# Patient Record
Sex: Female | Born: 1960 | Race: White | Hispanic: No | Marital: Married | Smoking: Never smoker
Health system: Southern US, Community
[De-identification: ages and names within clinical notes are randomized; demographics above are authoritative.]

## PROBLEM LIST (undated history)

## (undated) DIAGNOSIS — C801 Malignant (primary) neoplasm, unspecified: Secondary | ICD-10-CM

## (undated) DIAGNOSIS — Z923 Personal history of irradiation: Secondary | ICD-10-CM

## (undated) DIAGNOSIS — C50919 Malignant neoplasm of unspecified site of unspecified female breast: Secondary | ICD-10-CM

## (undated) DIAGNOSIS — Z8041 Family history of malignant neoplasm of ovary: Secondary | ICD-10-CM

## (undated) DIAGNOSIS — I10 Essential (primary) hypertension: Secondary | ICD-10-CM

## (undated) DIAGNOSIS — T4145XA Adverse effect of unspecified anesthetic, initial encounter: Secondary | ICD-10-CM

## (undated) DIAGNOSIS — T8859XA Other complications of anesthesia, initial encounter: Secondary | ICD-10-CM

## (undated) DIAGNOSIS — Z803 Family history of malignant neoplasm of breast: Secondary | ICD-10-CM

## (undated) HISTORY — PX: COLONOSCOPY: SHX174

## (undated) HISTORY — PX: REDUCTION MAMMAPLASTY: SUR839

## (undated) HISTORY — DX: Family history of malignant neoplasm of breast: Z80.3

## (undated) HISTORY — PX: COSMETIC SURGERY: SHX468

## (undated) HISTORY — DX: Family history of malignant neoplasm of ovary: Z80.41

---
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
## 1998-01-04 ENCOUNTER — Inpatient Hospital Stay (HOSPITAL_COMMUNITY): Admission: AD | Admit: 1998-01-04 | Discharge: 1998-01-04 | Payer: Self-pay | Admitting: Obstetrics and Gynecology

## 1998-03-08 ENCOUNTER — Observation Stay (HOSPITAL_COMMUNITY): Admission: AD | Admit: 1998-03-08 | Discharge: 1998-03-09 | Payer: Self-pay | Admitting: *Deleted

## 1998-04-14 ENCOUNTER — Ambulatory Visit (HOSPITAL_COMMUNITY): Admission: RE | Admit: 1998-04-14 | Discharge: 1998-04-14 | Payer: Self-pay | Admitting: Obstetrics and Gynecology

## 1998-05-12 ENCOUNTER — Inpatient Hospital Stay (HOSPITAL_COMMUNITY): Admission: AD | Admit: 1998-05-12 | Discharge: 1998-05-12 | Payer: Self-pay | Admitting: Obstetrics and Gynecology

## 1998-05-15 ENCOUNTER — Inpatient Hospital Stay: Admission: EM | Admit: 1998-05-15 | Discharge: 1998-05-15 | Payer: Self-pay | Admitting: *Deleted

## 1998-05-18 ENCOUNTER — Inpatient Hospital Stay (HOSPITAL_COMMUNITY): Admission: AD | Admit: 1998-05-18 | Discharge: 1998-05-20 | Payer: Self-pay | Admitting: Obstetrics and Gynecology

## 1998-06-17 ENCOUNTER — Inpatient Hospital Stay (HOSPITAL_COMMUNITY): Admission: AD | Admit: 1998-06-17 | Discharge: 1998-06-17 | Payer: Self-pay | Admitting: Obstetrics and Gynecology

## 1998-06-21 ENCOUNTER — Inpatient Hospital Stay (HOSPITAL_COMMUNITY): Admission: AD | Admit: 1998-06-21 | Discharge: 1998-06-24 | Payer: Self-pay | Admitting: Obstetrics and Gynecology

## 1999-04-06 ENCOUNTER — Encounter: Payer: Self-pay | Admitting: General Surgery

## 1999-04-06 ENCOUNTER — Emergency Department (HOSPITAL_COMMUNITY): Admission: EM | Admit: 1999-04-06 | Discharge: 1999-04-06 | Payer: Self-pay | Admitting: Emergency Medicine

## 1999-07-21 ENCOUNTER — Emergency Department (HOSPITAL_COMMUNITY): Admission: EM | Admit: 1999-07-21 | Discharge: 1999-07-21 | Payer: Self-pay | Admitting: Emergency Medicine

## 1999-10-16 ENCOUNTER — Ambulatory Visit (HOSPITAL_COMMUNITY): Admission: RE | Admit: 1999-10-16 | Discharge: 1999-10-16 | Payer: Self-pay | Admitting: Family Medicine

## 1999-10-16 ENCOUNTER — Encounter: Payer: Self-pay | Admitting: Family Medicine

## 2001-12-10 ENCOUNTER — Encounter: Admission: RE | Admit: 2001-12-10 | Discharge: 2002-03-10 | Payer: Self-pay | Admitting: Family Medicine

## 2003-01-27 ENCOUNTER — Other Ambulatory Visit: Admission: RE | Admit: 2003-01-27 | Discharge: 2003-01-27 | Payer: Self-pay | Admitting: Obstetrics and Gynecology

## 2004-09-19 ENCOUNTER — Other Ambulatory Visit: Admission: RE | Admit: 2004-09-19 | Discharge: 2004-09-19 | Payer: Self-pay | Admitting: Obstetrics & Gynecology

## 2005-11-23 ENCOUNTER — Other Ambulatory Visit: Admission: RE | Admit: 2005-11-23 | Discharge: 2005-11-23 | Payer: Self-pay | Admitting: Obstetrics & Gynecology

## 2007-01-28 ENCOUNTER — Encounter: Admission: RE | Admit: 2007-01-28 | Discharge: 2007-01-28 | Payer: Self-pay | Admitting: Obstetrics & Gynecology

## 2012-07-07 ENCOUNTER — Encounter: Payer: Self-pay | Admitting: Gastroenterology

## 2012-08-18 ENCOUNTER — Encounter: Payer: Self-pay | Admitting: Gastroenterology

## 2012-08-18 ENCOUNTER — Ambulatory Visit (AMBULATORY_SURGERY_CENTER): Payer: 59 | Admitting: *Deleted

## 2012-08-18 VITALS — Ht 64.0 in | Wt 171.6 lb

## 2012-08-18 DIAGNOSIS — Z1211 Encounter for screening for malignant neoplasm of colon: Secondary | ICD-10-CM

## 2012-08-18 MED ORDER — MOVIPREP 100 G PO SOLR: ORAL | Status: DC

## 2012-08-22 ENCOUNTER — Ambulatory Visit (AMBULATORY_SURGERY_CENTER): Payer: 59 | Admitting: Gastroenterology

## 2012-08-22 ENCOUNTER — Encounter: Payer: Self-pay | Admitting: Gastroenterology

## 2012-08-22 VITALS — BP 130/88 | HR 50 | Temp 98.2°F | Resp 14 | Ht 64.0 in | Wt 164.0 lb

## 2012-08-22 DIAGNOSIS — D126 Benign neoplasm of colon, unspecified: Secondary | ICD-10-CM

## 2012-08-22 DIAGNOSIS — Z1211 Encounter for screening for malignant neoplasm of colon: Secondary | ICD-10-CM

## 2012-08-22 DIAGNOSIS — D129 Benign neoplasm of anus and anal canal: Secondary | ICD-10-CM

## 2012-08-22 DIAGNOSIS — D128 Benign neoplasm of rectum: Secondary | ICD-10-CM

## 2012-08-22 MED ORDER — SODIUM CHLORIDE 0.9 % IV SOLN: 500.0000 mL | INTRAVENOUS | Status: DC

## 2012-08-22 NOTE — Progress Notes (Signed)
Patient did not experience any of the following events: a burn prior to discharge; a fall within the facility; wrong site/side/patient/procedure/implant event; or a hospital transfer or hospital admission upon discharge from the facility. (G8907) Patient did not have preoperative order for IV antibiotic SSI prophylaxis. (G8918)  

## 2012-08-22 NOTE — Op Note (Addendum)
[facility] [address] [place], [address]   COLONOSCOPY PROCEDURE REPORT  PATIENT: Bonnie Marquez, Bonnie Marquez  MR#: [id number] BIRTHDATE: 1961/01/09 , 51  yrs. old GENDER: Female ENDOSCOPIST: Meryl Dare, MD, [facility] REFERRED XB:JYNWGN Foy [place], M.D.  Annamaria Helling, M.D. PROCEDURE DATE:  08/22/2012 PROCEDURE:   Colonoscopy with snare polypectomy and Colonoscopy with biopsy ASA CLASS:   Class I INDICATIONS: average risk screening,  heme + stool MEDICATIONS: MAC sedation, administered by CRNA and propofol (Diprivan) 230mg  IV DESCRIPTION OF PROCEDURE:   After the risks benefits and alternatives of the procedure were thoroughly explained, informed consent was obtained.  A digital rectal exam revealed no abnormalities of the rectum.   The LB CF-[id number]  endoscope was introduced through the anus and advanced to the cecum, which was identified by both the appendix and ileocecal valve. No adverse events experienced.   The quality of the prep was good, using MoviPrep  The instrument was then slowly withdrawn as the colon was fully examined.  COLON FINDINGS: A semi-pedunculated polyp measuring 8 mm in size was found in the transverse colon.  A polypectomy was performed using snare cautery.  The resection was complete and the polyp tissue was completely retrieved.   A sessile polyp measuring 4 mm in size was found in the rectum.  A polypectomy was performed with cold forceps.  The resection was complete and the polyp tissue was completely retrieved.   The colon was otherwise normal.  There was no diverticulosis, inflammation, polyps or cancers unless previously stated.  Retroflexed views revealed small internal hemorrhoids. The time to cecum=2 minutes 06 seconds.  Withdrawal time=9 minutes 33 seconds.  The scope was withdrawn and the procedure completed.  COMPLICATIONS: There were no complications.  ENDOSCOPIC IMPRESSION: 1.   Semi-pedunculated polyp measuring 8 mm in  the transverse colon; polypectomy was performed using snare cautery 2.   Sessile polyp measuring 4 mm in size was found in the rectum; polypectomy was performed with cold forceps 3.   Small internal hemorrhoids  RECOMMENDATIONS: 1.  Hold aspirin, aspirin products, and anti-inflammatory medication for 2 weeks. 2.  Await pathology results 3.  Repeat colonoscopy in 5 years if polyp adenomatous; otherwise 10 years  eSigned:  Meryl Dare, MD, [facility] 08/22/2012 10:39 AM Revised: 08/22/2012 10:39 AM

## 2012-08-22 NOTE — Patient Instructions (Addendum)
YOU HAD AN ENDOSCOPIC PROCEDURE TODAY AT THE [facility]: Refer to the procedure report that was given to you for any specific questions about what was found during the examination.  If the procedure report does not answer your questions, please call your gastroenterologist to clarify.  If you requested that your care partner not be given the details of your procedure findings, then the procedure report has been included in a sealed envelope for you to review at your convenience later.  YOU SHOULD EXPECT: Some feelings of bloating in the abdomen. Passage of more gas than usual.  Walking can help get rid of the air that was put into your GI tract during the procedure and reduce the bloating. If you had a lower endoscopy (such as a colonoscopy or flexible sigmoidoscopy) you may notice spotting of blood in your stool or on the toilet paper. If you underwent a bowel prep for your procedure, then you may not have a normal bowel movement for a few days.  DIET: Your first meal following the procedure should be a light meal and then it is ok to progress to your normal diet.  A half-sandwich or bowl of soup is an example of a good first meal.  Heavy or fried foods are harder to digest and may make you feel nauseous or bloated.  Likewise meals heavy in dairy and vegetables can cause extra gas to form and this can also increase the bloating.  Drink plenty of fluids but you should avoid alcoholic beverages for 24 hours.  ACTIVITY: Your care partner should take you home directly after the procedure.  You should plan to take it easy, moving slowly for the rest of the day.  You can resume normal activity the day after the procedure however you should NOT DRIVE or use heavy machinery for 24 hours (because of the sedation medicines used during the test).    SYMPTOMS TO REPORT IMMEDIATELY: A gastroenterologist can be reached at any hour.  During normal business hours, 8:30 AM to 5:00 PM Monday through Friday,  call [phone].  After hours and on weekends, please call the GI answering service at [phone] who will take a message and have the physician on call contact you.   Following lower endoscopy (colonoscopy or flexible sigmoidoscopy):  Excessive amounts of blood in the stool  Significant tenderness or worsening of abdominal pains  Swelling of the abdomen that is new, acute  Fever of 100F or higher   If any biopsies were taken you will be contacted by phone or by letter within the next 1-3 weeks.  Call your gastroenterologist if you have not heard about the biopsies in 3 weeks.  Our staff will call the home number listed on your records the next business day following your procedure to check on you and address any questions or concerns that you may have at that time regarding the information given to you following your procedure. This is a courtesy call and so if there is no answer at the home number and we have not heard from you through the emergency physician on call, we will assume that you have returned to your regular daily activities without incident.  SIGNATURES/CONFIDENTIALITY: You and/or your care partner have signed paperwork which will be entered into your electronic medical record.  These signatures attest to the fact that that the information above on your After Visit Summary has been reviewed and is understood.  Full responsibility of the confidentiality of this discharge  information lies with you and/or your care-partner.   Polyp information given.    Hold aspirin, aspirin products and anti-inflammatory medications for 2 weeks.

## 2012-08-25 ENCOUNTER — Telehealth: Payer: Self-pay | Admitting: *Deleted

## 2012-08-25 NOTE — Telephone Encounter (Signed)
  Follow up Call-  Call back number 08/22/2012  Post procedure Call Back phone  # [phone]  Permission to leave phone message Yes     Patient questions:  Do you have a fever, pain , or abdominal swelling? no Pain Score  0 *  Have you tolerated food without any problems? yes  Have you been able to return to your normal activities? yes  Do you have any questions about your discharge instructions: Diet   no Medications  no Follow up visit  no  Do you have questions or concerns about your Care? no  Actions: * If pain score is 4 or above: No action needed, pain <4.

## 2012-08-26 ENCOUNTER — Encounter: Payer: Self-pay | Admitting: Gastroenterology

## 2013-03-31 ENCOUNTER — Other Ambulatory Visit: Payer: Self-pay | Admitting: Obstetrics & Gynecology

## 2014-07-26 ENCOUNTER — Other Ambulatory Visit: Payer: Self-pay | Admitting: Obstetrics & Gynecology

## 2014-07-27 LAB — CYTOLOGY - PAP

## 2015-10-30 DIAGNOSIS — C50919 Malignant neoplasm of unspecified site of unspecified female breast: Secondary | ICD-10-CM

## 2015-10-30 DIAGNOSIS — Z923 Personal history of irradiation: Secondary | ICD-10-CM

## 2015-10-30 HISTORY — PX: BREAST LUMPECTOMY: SHX2

## 2015-10-30 HISTORY — DX: Malignant neoplasm of unspecified site of unspecified female breast: C50.919

## 2015-10-30 HISTORY — DX: Personal history of irradiation: Z92.3

## 2016-08-23 ENCOUNTER — Other Ambulatory Visit: Payer: Self-pay | Admitting: Obstetrics & Gynecology

## 2016-08-23 DIAGNOSIS — Z1231 Encounter for screening mammogram for malignant neoplasm of breast: Secondary | ICD-10-CM

## 2016-08-24 ENCOUNTER — Ambulatory Visit
Admission: RE | Admit: 2016-08-24 | Discharge: 2016-08-24 | Disposition: A | Discharge status: Home / Self Care | Payer: PRIVATE HEALTH INSURANCE | Source: Ambulatory Visit | Attending: Obstetrics & Gynecology | Admitting: Obstetrics & Gynecology

## 2016-08-24 ENCOUNTER — Other Ambulatory Visit: Payer: Self-pay | Admitting: Obstetrics & Gynecology

## 2016-08-24 DIAGNOSIS — Z1231 Encounter for screening mammogram for malignant neoplasm of breast: Secondary | ICD-10-CM

## 2016-08-27 ENCOUNTER — Other Ambulatory Visit: Payer: Self-pay | Admitting: Obstetrics & Gynecology

## 2016-08-28 LAB — CYTOLOGY - PAP

## 2016-08-29 ENCOUNTER — Other Ambulatory Visit: Payer: Self-pay | Admitting: Obstetrics & Gynecology

## 2016-08-29 DIAGNOSIS — R928 Other abnormal and inconclusive findings on diagnostic imaging of breast: Secondary | ICD-10-CM

## 2016-08-31 ENCOUNTER — Ambulatory Visit
Admission: RE | Admit: 2016-08-31 | Discharge: 2016-08-31 | Disposition: A | Discharge status: Home / Self Care | Payer: No Typology Code available for payment source | Source: Ambulatory Visit | Attending: Obstetrics & Gynecology | Admitting: Obstetrics & Gynecology

## 2016-08-31 ENCOUNTER — Other Ambulatory Visit: Payer: Self-pay | Admitting: Obstetrics & Gynecology

## 2016-08-31 DIAGNOSIS — R928 Other abnormal and inconclusive findings on diagnostic imaging of breast: Secondary | ICD-10-CM

## 2016-08-31 DIAGNOSIS — N6320 Unspecified lump in the left breast, unspecified quadrant: Secondary | ICD-10-CM

## 2016-09-03 ENCOUNTER — Other Ambulatory Visit: Payer: No Typology Code available for payment source

## 2016-09-03 ENCOUNTER — Telehealth (HOSPITAL_COMMUNITY): Payer: Self-pay | Admitting: *Deleted

## 2016-09-03 NOTE — Telephone Encounter (Signed)
Telephoned patient at home number and left message to return call to [facility] 

## 2016-09-06 ENCOUNTER — Other Ambulatory Visit: Payer: Self-pay | Admitting: Obstetrics & Gynecology

## 2016-09-06 DIAGNOSIS — N6320 Unspecified lump in the left breast, unspecified quadrant: Secondary | ICD-10-CM

## 2016-09-07 ENCOUNTER — Ambulatory Visit (HOSPITAL_COMMUNITY)
Admission: RE | Admit: 2016-09-07 | Discharge: 2016-09-07 | Disposition: A | Discharge status: Home / Self Care | Payer: No Typology Code available for payment source | Source: Ambulatory Visit | Attending: Obstetrics and Gynecology | Admitting: Obstetrics and Gynecology

## 2016-09-07 ENCOUNTER — Other Ambulatory Visit: Payer: Self-pay | Admitting: Obstetrics & Gynecology

## 2016-09-07 ENCOUNTER — Encounter (HOSPITAL_COMMUNITY): Payer: Self-pay

## 2016-09-07 ENCOUNTER — Ambulatory Visit
Admission: RE | Admit: 2016-09-07 | Discharge: 2016-09-07 | Disposition: A | Discharge status: Home / Self Care | Payer: No Typology Code available for payment source | Source: Ambulatory Visit | Attending: Obstetrics & Gynecology | Admitting: Obstetrics & Gynecology

## 2016-09-07 VITALS — BP 162/110 | Temp 97.9°F | Ht 64.0 in | Wt 171.6 lb

## 2016-09-07 DIAGNOSIS — Z1239 Encounter for other screening for malignant neoplasm of breast: Secondary | ICD-10-CM

## 2016-09-07 DIAGNOSIS — N6320 Unspecified lump in the left breast, unspecified quadrant: Secondary | ICD-10-CM

## 2016-09-07 HISTORY — DX: Essential (primary) hypertension: I10

## 2016-09-07 HISTORY — PX: BREAST BIOPSY: SHX20

## 2016-09-07 NOTE — Patient Instructions (Signed)
Explained breast self awareness with Scherry Ran. Patient did not need a Pap smear today due to last Pap smear was 08/27/2016. Let her know [facility]CP will cover Pap smears every 3 years unless has a history of abnormal Pap smears. Referred patient to the [facility] for a left breast biopsy per recommendation. Appointment scheduled for Friday, September 07, 2016 at 1545. Let patient know the [facility] will follow up with her within the next week with results of biopsy. Gerrit Heck Gunnerson verbalized understanding.  Danijela Vessey, Arvil Chaco, RN 3:37 PM

## 2016-09-07 NOTE — Progress Notes (Signed)
Patient referred to [facility] by the [facility] due to recommending a left breast biopsy. Screening mammogram completed 08/24/2016 and left breast diagnostic mammogram completed 08/31/2016.  Pap Smear:  Pap smear not completed today. Last Pap smear was 08/27/2016 at [facility] and normal. Per patient has no history of an abnormal Pap smear. Last three Pap smear results are in EPIC.  Physical exam: Breasts Breasts symmetrical. No skin abnormalities bilateral breasts. No nipple retraction bilateral breasts. No nipple discharge bilateral breasts. No lymphadenopathy. No lumps palpated bilateral breasts. No complaints of pain or tenderness on exam. Referred patient to the [facility] for a left breast biopsy per recommendation. Appointment scheduled for Friday, September 07, 2016 at 1545.        Pelvic/Bimanual No Pap smear completed today since last Pap smear was 08/27/2016. Pap smear not indicated per B[facility]CP guidelines.   Smoking History: Patient has never smoked.  Patient Navigation: Patient education provided. Access to services provided for patient through [facility] program.   Colorectal Cancer Screening: Per patient had a colonoscopy completed in 2016. No complaints today.

## 2016-09-10 ENCOUNTER — Encounter (HOSPITAL_COMMUNITY): Payer: Self-pay | Admitting: *Deleted

## 2016-09-11 ENCOUNTER — Telehealth: Payer: Self-pay | Admitting: *Deleted

## 2016-09-11 NOTE — Telephone Encounter (Signed)
Left vm for return call regarding [facility] for 09/19/16. Contact information provided.

## 2016-09-12 ENCOUNTER — Other Ambulatory Visit: Payer: Self-pay | Admitting: Obstetrics & Gynecology

## 2016-09-12 ENCOUNTER — Telehealth: Payer: Self-pay | Admitting: *Deleted

## 2016-09-12 DIAGNOSIS — N6320 Unspecified lump in the left breast, unspecified quadrant: Secondary | ICD-10-CM

## 2016-09-12 DIAGNOSIS — Z170 Estrogen receptor positive status [ER+]: Principal | ICD-10-CM

## 2016-09-12 DIAGNOSIS — C50212 Malignant neoplasm of upper-inner quadrant of left female breast: Secondary | ICD-10-CM | POA: Insufficient documentation

## 2016-09-12 NOTE — Telephone Encounter (Signed)
Left message for a return phone call to schedule for [facility] 11/22.

## 2016-09-12 NOTE — Telephone Encounter (Signed)
Received call back from patient.  Confirmed [facility] for 09/19/16 at 815am .  Instructions and contact information given.

## 2016-09-13 ENCOUNTER — Ambulatory Visit
Admission: RE | Admit: 2016-09-13 | Discharge: 2016-09-13 | Disposition: A | Discharge status: Home / Self Care | Payer: No Typology Code available for payment source | Source: Ambulatory Visit | Attending: Obstetrics & Gynecology | Admitting: Obstetrics & Gynecology

## 2016-09-13 DIAGNOSIS — N6320 Unspecified lump in the left breast, unspecified quadrant: Secondary | ICD-10-CM

## 2016-09-13 HISTORY — PX: BREAST BIOPSY: SHX20

## 2016-09-19 ENCOUNTER — Encounter: Payer: Self-pay | Admitting: Hematology and Oncology

## 2016-09-19 ENCOUNTER — Ambulatory Visit: Payer: Self-pay | Admitting: General Surgery

## 2016-09-19 ENCOUNTER — Ambulatory Visit (HOSPITAL_BASED_OUTPATIENT_CLINIC_OR_DEPARTMENT_OTHER): Payer: No Typology Code available for payment source | Admitting: Hematology and Oncology

## 2016-09-19 ENCOUNTER — Encounter: Payer: Self-pay | Admitting: Physical Therapy

## 2016-09-19 ENCOUNTER — Ambulatory Visit: Payer: No Typology Code available for payment source | Attending: General Surgery | Admitting: Physical Therapy

## 2016-09-19 ENCOUNTER — Other Ambulatory Visit (HOSPITAL_BASED_OUTPATIENT_CLINIC_OR_DEPARTMENT_OTHER): Payer: No Typology Code available for payment source

## 2016-09-19 ENCOUNTER — Ambulatory Visit
Admission: RE | Admit: 2016-09-19 | Discharge: 2016-09-19 | Disposition: A | Discharge status: Home / Self Care | Payer: No Typology Code available for payment source | Source: Ambulatory Visit | Attending: Radiation Oncology | Admitting: Radiation Oncology

## 2016-09-19 ENCOUNTER — Other Ambulatory Visit: Payer: Self-pay | Admitting: General Surgery

## 2016-09-19 DIAGNOSIS — Z170 Estrogen receptor positive status [ER+]: Secondary | ICD-10-CM

## 2016-09-19 DIAGNOSIS — C50212 Malignant neoplasm of upper-inner quadrant of left female breast: Secondary | ICD-10-CM

## 2016-09-19 DIAGNOSIS — R293 Abnormal posture: Secondary | ICD-10-CM | POA: Insufficient documentation

## 2016-09-19 LAB — COMPREHENSIVE METABOLIC PANEL
ALBUMIN: 4.1 g/dL (ref 3.5–5.0)
ALK PHOS: 78 U/L (ref 40–150)
ALT: 57 U/L — AB (ref 0–55)
ANION GAP: 12 meq/L — AB (ref 3–11)
AST: 50 U/L — ABNORMAL HIGH (ref 5–34)
BILIRUBIN TOTAL: 1.28 mg/dL — AB (ref 0.20–1.20)
BUN: 15.2 mg/dL (ref 7.0–26.0)
CO2: 27 mEq/L (ref 22–29)
CREATININE: 0.8 mg/dL (ref 0.6–1.1)
Calcium: 10.6 mg/dL — ABNORMAL HIGH (ref 8.4–10.4)
Chloride: 99 mEq/L (ref 98–109)
EGFR: 88 mL/min/{1.73_m2} — AB (ref >90)
Glucose: 92 mg/dl (ref 70–140)
Potassium: 3.9 mEq/L (ref 3.5–5.1)
Sodium: 138 mEq/L (ref 136–145)
TOTAL PROTEIN: 7.7 g/dL (ref 6.4–8.3)

## 2016-09-19 LAB — CBC WITH DIFFERENTIAL/PLATELET
BASO%: 0.5 % (ref 0.0–2.0)
Basophils Absolute: 0 10*3/uL (ref 0.0–0.1)
EOS ABS: 0.2 10*3/uL (ref 0.0–0.5)
EOS%: 2.9 % (ref 0.0–7.0)
HEMATOCRIT: 42.7 % (ref 34.8–46.6)
HEMOGLOBIN: 14.4 g/dL (ref 11.6–15.9)
LYMPH#: 1.5 10*3/uL (ref 0.9–3.3)
LYMPH%: 27.1 % (ref 14.0–49.7)
MCH: 31.9 pg (ref 25.1–34.0)
MCHC: 33.7 g/dL (ref 31.5–36.0)
MCV: 94.5 fL (ref 79.5–101.0)
MONO#: 0.6 10*3/uL (ref 0.1–0.9)
MONO%: 11.5 % (ref 0.0–14.0)
NEUT%: 58 % (ref 38.4–76.8)
NEUTROS ABS: 3.2 10*3/uL (ref 1.5–6.5)
PLATELETS: 254 10*3/uL (ref 145–400)
RBC: 4.52 10*6/uL (ref 3.70–5.45)
RDW: 13.2 % (ref 11.2–14.5)
WBC: 5.5 10*3/uL (ref 3.9–10.3)

## 2016-09-19 NOTE — Therapy (Signed)
[facility] [place], [place], [address] Phone: [phone]   Fax:  [phone]  Physical Therapy Evaluation  Patient Details  Name: Bonnie Marquez MRN: [id number] Date of Birth: 1960/11/03 Referring Provider: Dr. Autumn Messing  Encounter Date: 09/19/2016      PT End of Session - 09/19/16 1119    Visit Number 1   Number of Visits 1   PT Start Time 0950   PT Stop Time 1013   PT Time Calculation (min) 23 min   Activity Tolerance Patient tolerated treatment well   Behavior During Therapy [facility] for tasks assessed/performed      Past Medical History:  Diagnosis Date  . Hypertension     Past Surgical History:  Procedure Laterality Date  . APPENDECTOMY  1975  . [place]   x 2  . EYE MUSCLE SURGERY  1976   bilateral    There were no vitals filed for this visit.       Subjective Assessment - 09/19/16 1113    Subjective Patient reports she is here today to be seen for her newly diagnosed left breast cancer.   Patient is accompained by: Family member   Pertinent History Patient was diagnosed on 08/24/16 with left grade 1 invasive ductal carcinoma breast cancer.  It is ER/PR positive and HER2 negative with a Ki67 of 15%. It measures 6 mm and is located in the upper inner quadrant.   Patient Stated Goals Reduce lymphedema risk and learn post op shoulder ROM HEP   Currently in Pain? No/denies            [facility] PT Assessment - 09/19/16 0001      Assessment   Medical Diagnosis Left breast cancer   Referring Provider Dr. Autumn Messing   Onset Date/Surgical Date 08/24/16   Hand Dominance Right   Prior Therapy none     Precautions   Precautions Other (comment)   Precaution Comments active cancer     Restrictions   Weight Bearing Restrictions No     Balance Screen   Has the patient fallen in the past 6 months No   Has the patient had a decrease in activity level because of a fear of  falling?  No   Is the patient reluctant to leave their home because of a fear of falling?  No     Home Social worker Private residence   Living Arrangements Spouse/significant other;Children  Husband and 1 y.o. 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, hospital next week   Leisure [employer] daily 30-60 minutes     Cognition   Overall Cognitive Status Within Functional Limits for tasks assessed     Posture/Postural Control   Posture/Postural Control Postural limitations   Postural Limitations Forward head;Rounded Shoulders     ROM / Strength   AROM / PROM / Strength AROM;Strength     AROM   AROM Assessment Site Shoulder;Cervical   Right/Left Shoulder Right;Left   Right Shoulder Extension 46 Degrees   Right Shoulder Flexion 130 Degrees   Right Shoulder ABduction 148 Degrees   Right Shoulder Internal Rotation 72 Degrees   Right Shoulder External Rotation 76 Degrees   Left Shoulder Extension 52 Degrees   Left Shoulder  Flexion 144 Degrees   Left Shoulder ABduction 145 Degrees   Left Shoulder Internal Rotation 74 Degrees   Left Shoulder External Rotation 66 Degrees   Cervical Flexion WNL   Cervical Extension WNL   Cervical - Right Side Bend WNL   Cervical - Left Side Bend WNL   Cervical - Right Rotation WNL   Cervical - Left Rotation WNL     Strength   Overall Strength Within functional limits for tasks performed           LYMPHEDEMA/ONCOLOGY QUESTIONNAIRE - 09/19/16 1117      Type   Cancer Type Left breast cancer     Lymphedema Assessments   Lymphedema Assessments Upper extremities     Right Upper Extremity Lymphedema   10 cm Proximal to Olecranon Process 27.1 cm   15 cm Proximal to Ulnar Styloid Process 23.2 cm   10 cm Proximal to Ulnar Styloid Process 15.9 cm   Just  Proximal to Ulnar Styloid Process 18.8 cm   Across Hand at [employer] 6.6 cm     Left Upper Extremity Lymphedema   10 cm Proximal to Olecranon Process 33.1 cm   Olecranon Process 27.5 cm   10 cm Proximal to Ulnar Styloid Process 22.7 cm   Just Proximal to Ulnar Styloid Process 15.5 cm   Across Hand at [employer] 18.6 cm   At [place] of 2nd Digit 6.2 cm      Patient was instructed today in a home exercise program today for post op shoulder range of motion. These included active assist shoulder flexion in sitting, scapular retraction, wall walking with shoulder abduction, and hands behind head external rotation.  She was encouraged to do these twice a day, holding 3 seconds and repeating 5 times when permitted by her physician.         PT Education - 09/19/16 1118    Education provided Yes   Education Details Lymphedema risk reduction and post op shoulder ROM HEP   Person(s) Educated Patient;Spouse   Methods Explanation;Demonstration;Handout   Comprehension Returned demonstration;Verbalized understanding              Breast Clinic Goals - 09/19/16 1123      Patient will be able to verbalize understanding of pertinent lymphedema risk reduction practices relevant to her diagnosis specifically related to skin care.   Time 1   Period Days   Status Achieved     Patient will be able to return demonstrate and/or verbalize understanding of the post-op home exercise program related to regaining shoulder range of motion.   Time 1   Period Days   Status Achieved     Patient will be able to verbalize understanding of the importance of attending the postoperative After Breast Cancer Class for further lymphedema risk reduction education and therapeutic exercise.   Time 1   Period Days   Status Achieved              Plan - 09/19/16 1119    Clinical Impression Statement Patient was diagnosed on 08/24/16 with left grade 1 invasive ductal carcinoma breast cancer.  It is  ER/PR positive and HER2 negative with a Ki67 of 15%. It measures 6 mm and is located in the upper inner quadrant. Her multidisciplinary medical team met prior to her assessments to determine a recommended treatment plan.  She is planning to have a left lumpectomy and sentinel node biopsy followed by Oncotype testing if mass is > 1 cm, radiation  and anti-estrogen therapy.  Due to her lack of comorbidities, her eval is of low complexity.   Rehab Potential Excellent   Clinical Impairments Affecting Rehab Potential none   PT Frequency One time visit   PT Treatment/Interventions Therapeutic exercise;Patient/family education   PT Next Visit Plan Will f/u after surgery to determine PT needs   PT Home Exercise Plan Post op shoulder ROM HEP   Consulted and Agree with Plan of Care Patient;Family member/caregiver   Family Member Consulted Husband      Patient will benefit from skilled therapeutic intervention in order to improve the following deficits and impairments:  Postural dysfunction, Pain, Decreased knowledge of precautions, Impaired UE functional use, Decreased range of motion  Visit Diagnosis: Carcinoma of upper-inner quadrant of left breast in female, estrogen receptor positive ([place]) - Plan: PT plan of care cert/re-cert  Abnormal posture - Plan: PT plan of care cert/re-cert   Patient will follow up at outpatient cancer rehab if needed following surgery.  If the patient requires physical therapy at that time, a specific plan will be dictated and sent to the referring physician for approval. The patient was educated today on appropriate basic range of motion exercises to begin post operatively and the importance of attending the After Breast Cancer class following surgery.  Patient was educated today on lymphedema risk reduction practices as it pertains to recommendations that will benefit the patient immediately following surgery.  She verbalized good understanding.  No additional physical therapy  is indicated at this time.      Problem List Patient Active Problem List   Diagnosis Date Noted  . Malignant neoplasm of upper-inner quadrant of left breast in female, estrogen receptor positive ([place]) 09/12/2016   Annia Friendly, PT 09/19/16 11:25 AM  [place], [place], [address] Phone: [phone]   Fax:  [phone]  Name: HOSANNA BETLEY MRN: [id number] Date of Birth: Aug 11, 1961

## 2016-09-19 NOTE — Assessment & Plan Note (Signed)
09/13/2016: Left breast biopsy UIQ: Grade 1 invasive ductal carcinoma ER 95%, PR 90%, Ki-67 15%, HER-2 negative ratio 1.41; screening detected left breast mass at 11:30 position 6 mm size, axilla negative, T1 BN 0 stage IA clinical stage  Pathology and radiology counseling:Discussed with the patient, the details of pathology including the type of breast cancer,the clinical staging, the significance of ER, PR and HER-2/neu receptors and the implications for treatment. After reviewing the pathology in detail, we proceeded to discuss the different treatment options between surgery, radiation, chemotherapy, antiestrogen therapies.  Recommendations: 1. Breast conserving surgery followed by 2. Oncotype DX testing to determine if chemotherapy would be of any benefit followed by 3. Adjuvant radiation therapy followed by 4. Adjuvant antiestrogen therapy  Oncotype counseling: I discussed Oncotype DX test. I explained to the patient that this is a 21 gene panel to evaluate patient tumors DNA to calculate recurrence score. This would help determine whether patient has high risk or intermediate risk or low risk breast cancer. She understands that if her tumor was found to be high risk, she would benefit from systemic chemotherapy. If low risk, no need of chemotherapy. If she was found to be intermediate risk, we would need to evaluate the score as well as other risk factors and determine if an abbreviated chemotherapy may be of benefit.  Return to clinic after surgery to discuss final pathology report and then determine if Oncotype DX testing will need to be sent.

## 2016-09-19 NOTE — Progress Notes (Signed)
Radiation Oncology         [phone]) [phone] ________________________________  Initial Outpatient Consultation  Name: Bonnie Marquez MRN: [id number]  Date: 09/19/2016  DOB: 02/15/1961  LO:VFIE,PPIRJJ M, MD  Jovita Kussmaul, MD   REFERRING PHYSICIAN: Autumn Messing III, MD  DIAGNOSIS: The encounter diagnosis was Malignant neoplasm of upper-inner quadrant of left breast in female, estrogen receptor positive ([place]).  HISTORY OF PRESENT ILLNESS::Bonnie Marquez is a 55 y.o. female who presented for a screening mammogram on 08/24/16, showing an upper inner quadrant left breast mass. Subsequent diagnostic mammography on 08/31/16 showed a suspicious irregular mass, measuring approximately 6 mm. Biopsy of the UIQ of the left breast on 09/13/16 showed invasive mammary carcinoma (ER 95% positive, PR 90% positive, HER2 negative, Ki67: 15%). Patient also had a second biopsy in this region which was very similar in pathologic interpretation and felt to be consistent with being the same process.  The patient presents to breast clinic today to discuss the role that radiation may play in the treatment of her disease.  PREVIOUS RADIATION THERAPY: No  PAST MEDICAL HISTORY:  has a past medical history of Hypertension.    PAST SURGICAL HISTORY: Past Surgical History:  Procedure Laterality Date  . APPENDECTOMY  1975  . [place]   x 2  . EYE MUSCLE SURGERY  1976   bilateral    FAMILY HISTORY: family history includes Breast cancer in her maternal grandmother and paternal grandmother; Diabetes in her mother; Heart attack in her brother; Hypertension in her father and mother; Lung cancer in her father; Ovarian cancer in her mother; Stroke in her sister.  SOCIAL HISTORY:  reports that she has never smoked. She has never used smokeless tobacco. She reports that she does not drink alcohol or use drugs.  ALLERGIES: Codeine  MEDICATIONS:  Current Outpatient Prescriptions  Medication Sig  Dispense Refill  . triamterene-hydrochlorothiazide (DYAZIDE) 37.5-25 MG capsule Take 1 capsule by mouth daily.     No current facility-administered medications for this encounter.     REVIEW OF SYSTEMS:  A 15 point review of systems is documented in the electronic medical record. This was obtained by the nursing staff. However, I reviewed this with the patient to discuss relevant findings and make appropriate changes.  Pertinent items noted in HPI and remainder of comprehensive ROS otherwise negative.   Gynecologic History             Age at first menstrual period? 13             Are you still having periods? No Approximate date of last period? 2 years ago             If you no longer have periods: Have you used hormone replacement? No Obstetric History:              How many children have you carried to term? 2 Your age at first live birth? 26             Pregnant now or trying to get pregnant? No             Have you used birth control pills or hormone shots for contraception? Yes             If so, for how long (or approximate dates)? 10-20 years ago Health Maintenance:             Have you ever had a colonoscopy? Yes If yes, date? 2016  Have you ever had a bone density? No             Date of your last PAP smear? 07/2016 Date of your FIRST mammogram? 20 years ago   PHYSICAL EXAM:  Vitals - 1 value per visit 09/19/2016  SYSTOLIC 142  DIASTOLIC 92  Pulse 82  Temperature 97.9  Respirations 18  Weight (lb) 168.1  Height 5\' 4"   BMI 28.85  VISIT REPORT    Lungs are clear to auscultation bilaterally. Heart has regular rate and rhythm. No palpable cervical, supraclavicular, or axillary adenopathy. On the right breast exam, no palpable mass or nipple discharge noted. There is a faint scar in the lower aspect of the breast from prior cosmetic surgery. On examination of the left breast, a small biopsy site is noted in the 11:30 position of the breast, with a second faint biopsy  site in the 2 o'clock position (this was directed toward the upper inner quadrant, however).   ECOG = 1  LABORATORY DATA:  Lab Results  Component Value Date   WBC 5.5 09/19/2016   HGB 14.4 09/19/2016   HCT 42.7 09/19/2016   MCV 94.5 09/19/2016   PLT 254 09/19/2016   NEUTROABS 3.2 09/19/2016   Lab Results  Component Value Date   NA 138 09/19/2016   K 3.9 09/19/2016   CO2 27 09/19/2016   GLUCOSE 92 09/19/2016   CREATININE 0.8 09/19/2016   CALCIUM 10.6 (H) 09/19/2016      RADIOGRAPHY: 09/21/2016 Breast Ltd Uni Left Inc Axilla  Result Date: 08/31/2016 CLINICAL DATA:  The patient returns after screening study for evaluation of possible left breast mass. EXAM: 2D DIGITAL DIAGNOSTIC LEFT MAMMOGRAM WITH CAD AND ADJUNCT TOMO ULTRASOUND LEFT BREAST COMPARISON:  08/24/2016 and earlier exams ACR Breast Density Category b: There are scattered areas of fibroglandular density. FINDINGS: Postoperative changes are identified in the left breast. Status post breast lift in 2016. Spot compression tomosynthesis views confirm presence of focal distortion in the upper inner quadrant which is further evaluated by ultrasound. Mammographic images were processed with CAD. On physical exam, I palpate no abnormality in the upper inner quadrant of the left breast. Targeted ultrasound is performed, showing irregular hypoechoic taller than wide mass in the 11:30 o'clock location of the left breast 3 cm from the nipple which measures 0.5 x 0.6 x 0.5 cm. There is associated acoustic shadowing. No internal vascularity identified by Doppler evaluation. Evaluation of the axilla is negative for adenopathy. IMPRESSION: Suspicious irregular mass in the upper inner quadrant of the left breast warranting biopsy. RECOMMENDATION: Ultrasound-guided core biopsy of mass in the 11:30 o'clock location of the left breast. I have discussed the findings and recommendations with the patient. Results were also provided in writing at the conclusion  of the visit. If applicable, a reminder letter will be sent to the patient regarding the next appointment. BI-RADS CATEGORY  4: Suspicious. Electronically Signed   By: 2017 M.D.   On: 08/31/2016 16:39   Mm Diag Breast Tomo Uni Left  Result Date: 08/31/2016 CLINICAL DATA:  The patient returns after screening study for evaluation of possible left breast mass. EXAM: 2D DIGITAL DIAGNOSTIC LEFT MAMMOGRAM WITH CAD AND ADJUNCT TOMO ULTRASOUND LEFT BREAST COMPARISON:  08/24/2016 and earlier exams ACR Breast Density Category b: There are scattered areas of fibroglandular density. FINDINGS: Postoperative changes are identified in the left breast. Status post breast lift in 2016. Spot compression tomosynthesis views confirm presence of focal distortion in the upper inner  quadrant which is further evaluated by ultrasound. Mammographic images were processed with CAD. On physical exam, I palpate no abnormality in the upper inner quadrant of the left breast. Targeted ultrasound is performed, showing irregular hypoechoic taller than wide mass in the 11:30 o'clock location of the left breast 3 cm from the nipple which measures 0.5 x 0.6 x 0.5 cm. There is associated acoustic shadowing. No internal vascularity identified by Doppler evaluation. Evaluation of the axilla is negative for adenopathy. IMPRESSION: Suspicious irregular mass in the upper inner quadrant of the left breast warranting biopsy. RECOMMENDATION: Ultrasound-guided core biopsy of mass in the 11:30 o'clock location of the left breast. I have discussed the findings and recommendations with the patient. Results were also provided in writing at the conclusion of the visit. If applicable, a reminder letter will be sent to the patient regarding the next appointment. BI-RADS CATEGORY  4: Suspicious. Electronically Signed   By: Nolon Nations M.D.   On: 08/31/2016 16:39   Mm Screening Breast Tomo Bilateral  Result Date: 08/28/2016 CLINICAL DATA:   Screening. EXAM: 2D DIGITAL SCREENING BILATERAL MAMMOGRAM WITH CAD AND ADJUNCT TOMO COMPARISON:  Previous exam(s). ACR Breast Density Category b: There are scattered areas of fibroglandular density. FINDINGS: In the left breast, a possible mass warrants further evaluation. In the right breast, no findings suspicious for malignancy. Images were processed with CAD. IMPRESSION: Further evaluation is suggested for possible mass in the left breast. RECOMMENDATION: Diagnostic mammogram and possibly ultrasound of the left breast. (Code:[id number]-L-32M) The patient will be contacted regarding the findings, and additional imaging will be scheduled. BI-RADS CATEGORY  0: Incomplete. Need additional imaging evaluation and/or prior mammograms for comparison. Electronically Signed   By: Nolon Nations M.D.   On: 08/28/2016 13:45   Mm Clip Placement Left  Result Date: 09/13/2016 CLINICAL DATA:  Status post stereotactic biopsy for architectural distortion within the upper inner quadrant of the left breast suspicious for multifocal disease. Additional biopsy-proven cancer within the upper left breast on earlier ultrasound-guided biopsy of 09/07/2016. EXAM: DIAGNOSTIC LEFT MAMMOGRAM POST STEREOTACTIC BIOPSY COMPARISON:  Previous exam(s). FINDINGS: Mammographic images were obtained following stereotactic guided biopsy of architectural distortion within the upper inner quadrant of the left breast. At the conclusion of the procedure, an X shaped tissue site marker was placed in the biopsy cavity. On this postprocedure mammogram, the clip appears to be displaced approximately 9 mm superior to the targeted distortion. IMPRESSION: Postprocedure mammogram for clip placement. X shaped biopsy clip placed today is located approximately 9 mm superior to the targeted architectural distortion. The X shaped clip placed today is approximately 2 cm posterior to the ribbon shaped clip demarcating the site of the biopsy-proven cancer via earlier  ultrasound-guided biopsy. Final Assessment: Post Procedure Mammograms for Marker Placement Electronically Signed   By: Franki Cabot M.D.   On: 09/13/2016 09:56   Mm Clip Placement Left  Result Date: 09/07/2016 CLINICAL DATA:  Post ultrasound-guided core needle biopsy of left breast 11:30 o'clock nodule. EXAM: DIAGNOSTIC LEFT MAMMOGRAM POST ULTRASOUND BIOPSY COMPARISON:  Previous exam(s). FINDINGS: Mammographic images were obtained following ultrasound guided biopsy of left breast 11:30 o'clock nodule. Two-view mammography demonstrates presence of ribbon shaped marker within the biopsy site. According to the location of the tissue marker, the sonographically demonstrated an biopsied nodule does not correspond to the original mammographically identified nodule. This was discussed with the patient and she is scheduled for a stereotactic biopsy on September 13 2016. IMPRESSION: Successful placement of ribbon shaped marker within  the biopsy site in the left breast 11:30 o'clock. According to the location of the tissue marker, the sonographically demonstrated an biopsied nodule does not correspond to the original mammographically identified nodule. This was discussed with the patient and she is scheduled for a stereotactic biopsy on September 13 2016. Final Assessment: Post Procedure Mammograms for Marker Placement Electronically Signed   By: Fidela Salisbury M.D.   On: 09/07/2016 17:18   Mm Lt Breast Bx W Loc Dev 1st Lesion Image Bx Spec Stereo Guide  Addendum Date: 09/14/2016   ADDENDUM REPORT: 09/14/2016 12:56 ADDENDUM: Pathology revealed grade I invasive mammary carcinoma in the upper inner quadrant of the left breast. This was found to be concordant by Dr. Franki Cabot. Pathology results were discussed with the patient by telephone. The patient reported doing well after the biopsy. Post biopsy instructions and care were reviewed and questions were answered. The patient was encouraged to call [facility] for any additional concerns. The patient was recently diagnosed with invasive ductal carcinoma in the left breast at 11:30. She is scheduled for [facility] on September 19, 2016. Pathology results reported by Susa Raring RN, BSN on 09/14/2016. Electronically Signed   By: Franki Cabot M.D.   On: 09/14/2016 12:56   Result Date: 09/14/2016 CLINICAL DATA:  Patient with left breast mass/distortion within the upper inner quadrant of the left breast presents today for stereotactic biopsy with 3D tomosynthesis guidance. Recently diagnosed left breast cancer within a separate area of the upper left breast. EXAM: LEFT BREAST STEREOTACTIC CORE NEEDLE BIOPSY COMPARISON:  Previous exams. FINDINGS: The patient and I discussed the procedure of stereotactic-guided biopsy including benefits and alternatives. We discussed the high likelihood of a successful procedure. We discussed the risks of the procedure including infection, bleeding, tissue injury, clip migration, and inadequate sampling. Informed written consent was given. The usual time out protocol was performed immediately prior to the procedure. Using sterile technique and 1% Lidocaine as local anesthetic, under stereotactic guidance, a 9 gauge vacuum assisted device was used to perform core needle biopsy of the mass/distortion within the upper inner quadrant of the left breast using a superior approach. At the conclusion of the procedure, a X shaped tissue marker clip was deployed into the biopsy cavity. Follow-up 2-view mammogram was performed and dictated separately. IMPRESSION: Stereotactic-guided biopsy of the left breast mass/distortion within the upper inner quadrant of the left breast, demarcated by X shaped tissue marker. No apparent complications. Postprocedure mammogram showed the X shaped clip to be approximately 9 mm superior to today's targeted mass/distortion. The X shaped clip is approximately 2 cm posterior to  the ribbon shaped clip demarcating the earlier biopsy proven malignancy. Electronically Signed: By: Franki Cabot M.D. 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 This was found to be concordant by Dr. Fidela Salisbury. Pathology results were discussed with the patient by telephone. The patient reported doing well after the biopsy. Post biopsy instructions and care were reviewed and questions were answered. The patient was encouraged to call [facility] for any additional concerns. The patient was referred to the [facility] at the [facility] on September 19, 2016. The patient is scheduled for a  left stereotactic biopsy on September 13, 2016. Pathology results reported by Susa Raring RN, BSN on 09/11/2016. Electronically Signed   By: Fidela Salisbury M.D.   On: 09/11/2016 07:42   Result Date: 09/17/2016 CLINICAL DATA:  Left breast 11:30 o'clock nodule. EXAM: ULTRASOUND GUIDED LEFT BREAST CORE NEEDLE BIOPSY COMPARISON:  Previous exam(s). FINDINGS: I met with the patient and we discussed the procedure of ultrasound-guided biopsy, including benefits and alternatives. We discussed the high likelihood of a successful procedure. We discussed the risks of the procedure, including infection, bleeding, tissue injury, clip migration, and inadequate sampling. Informed written consent was given. The usual time-out protocol was performed immediately prior to the procedure. Using sterile technique and 1% Lidocaine as local anesthetic, under direct ultrasound visualization, a 14 gauge spring-loaded device was used to perform biopsy of left breast nodule using a lateral approach. At the conclusion of the procedure a ribbon shaped tissue  marker clip was deployed into the biopsy cavity. Follow up 2 view mammogram was performed and dictated separately. IMPRESSION: Ultrasound guided biopsy of left breast 11:30 o'clock nodule. According to the location of the ribbon shaped marker on the postprocedure mammogram, the sonographically demonstrated an biopsied nodule does not correspond to the original mammographically identified nodule. This was discussed with the patient and she is scheduled for a stereotactic biopsy on September 13 2016. Electronically Signed: By: Fidela Salisbury M.D. 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 The patient had 2 biopsies, both of which appear very similar, suggesting this is one process. On imagining, the area spans approximately 2 cm. The patient would appear to be a good candidate for breast conservation therapy with lumpectomy and sentinel node procedure, followed by radiotherapy. The patient has a pertinent family history of uterine/ovarian? cancer in her mother, who recently died of this malignancy at the age of 58, as well as family history of breast cancer through her paternal grandmother. Because of this we discussed genetic evaluation, but will not proceed at this time.  PLAN: Tentative lumpectomy, sentinel node procedure, possibly with oncotype testing, depending on the size of the tumor. The patient will have radiation therapy if she proceeds with breast conservation therapy. She will be treated with aromatase inhibitor at a later date.   ------------------------------------------------  Blair Promise, PhD, MD  This document serves as a record of services personally performed by Gery Pray, MD. It was created on his behalf by Maryla Morrow, a trained medical scribe. The creation of this record is based on the scribe's personal observations and the provider's statements to them. This document has been checked and approved by the  attending provider.

## 2016-09-19 NOTE — Patient Instructions (Signed)

## 2016-09-19 NOTE — Progress Notes (Signed)
Nutrition Assessment  Reason for Assessment:  Pt seen in Breast Clinic  ASSESSMENT:   55 year old female with new diagnosis of left breast cancer.  Past medical history of HTN  Reports normal intake has been eating little less with stress of new diagnosis.  Medications:  reviewed  Labs: reviewed  Anthropometrics:   Height: 64 inches Weight: 168 pounds BMI: 28  Reports stable weight.  NUTRITION DIAGNOSIS: Food and nutrition related knowledge deficit related to new diagnosis of breast cancer as evidenced by no prior need for nutrition related information.  INTERVENTION:   Discussed and provided packet of information regarding nutritional tips for breast cancer patients.  Questions answered.  Teachback method used.      MONITORING, EVALUATION, and GOAL: Pt will consume a healthy plant based diet to maintain lean body mass throughout treatment.   Bonnie Marquez B. Zenia Resides, [place], [place] (pager)

## 2016-09-19 NOTE — Progress Notes (Signed)
[facility] CONSULT NOTE  Patient Care Team: Vania Rea, MD as PCP - General (Obstetrics and Gynecology)  CHIEF COMPLAINTS/PURPOSE OF CONSULTATION:  Newly diagnosed breast cancer  HISTORY OF PRESENTING ILLNESS:  Bonnie Marquez 55 y.o. female is here because of recent diagnosis of left breast cancer. Patient had a screening mammogram the detected abnormality in the left breast medial aspect which was spiculated. 11:30 position measures 6 mm. Axilla was negative. 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 She was presented this morning in the multidisciplinary tumor board and she is here today to discuss the treatment plan.  I reviewed her records extensively and collaborated the history with the patient.  SUMMARY OF ONCOLOGIC HISTORY:   Malignant neoplasm of upper-inner quadrant of left breast in female, estrogen receptor positive ([place])   09/13/2016 Initial Diagnosis    Left breast biopsy UIQ: Grade 1 invasive ductal carcinoma ER 95%, PR 90%, Ki-67 15%, HER-2 negative ratio 1.41; screening detected left breast mass at 11:30 position 6 mm size, axilla negative, T1 BN 0 stage IA clinical stage       In terms of breast cancer risk profile:  She menarched at early age of 89 and went to menopause at age 43  She had 2 pregnancy, her first child was born at age 52  She has received birth control pills for approximately 10-20 years.  She was never exposed to fertility medications or hormone replacement therapy.  She has  family history of Breast/GYN/GI cancer Paternal grandmother had breast cancer  MEDICAL HISTORY:  Past Medical History:  Diagnosis Date  . Hypertension     SURGICAL HISTORY: Past Surgical History:  Procedure Laterality Date  . APPENDECTOMY  1975  . [place]   x 2  . EYE MUSCLE SURGERY  1976   bilateral    SOCIAL HISTORY: Social History   Social  History  . Marital status: Married    Spouse name: N/A  . Number of children: N/A  . Years of education: N/A   Occupational History  . Not on file.   Social History Main Topics  . Smoking status: Never Smoker  . Smokeless tobacco: Never Used  . Alcohol use No  . Drug use: No  . Sexual activity: Yes    Birth control/ protection: None   Other Topics Concern  . Not on file   Social History Narrative  . No narrative on file    FAMILY HISTORY: Family History  Problem Relation Age of Onset  . Hypertension Mother   . Diabetes Mother   . Ovarian cancer Mother   . Hypertension Father   . Lung cancer Father   . Stroke Sister   . Heart attack Brother   . Breast cancer Maternal Grandmother   . Breast cancer Paternal Grandmother   . Colon cancer Neg Hx   . Stomach cancer Neg Hx     ALLERGIES:  is allergic to codeine.  MEDICATIONS:  Current Outpatient Prescriptions  Medication Sig Dispense Refill  . triamterene-hydrochlorothiazide (DYAZIDE) 37.5-25 MG capsule Take 1 capsule by mouth daily.     No current facility-administered medications for this visit.     REVIEW OF SYSTEMS:   Constitutional: Denies fevers, chills or abnormal night sweats Eyes: Denies blurriness of vision, double vision or watery eyes Ears, nose, mouth, throat, and face: Denies mucositis or sore throat Respiratory: Denies cough, dyspnea or  wheezes Cardiovascular: Denies palpitation, chest discomfort or lower extremity swelling Gastrointestinal:  Denies nausea, heartburn or change in bowel habits Skin: Denies abnormal skin rashes Lymphatics: Denies new lymphadenopathy or easy bruising Neurological:Denies numbness, tingling or new weaknesses Behavioral/Psych: Mood is stable, no new changes  Breast:  Denies any palpable lumps or discharge All other systems were reviewed with the patient and are negative.  PHYSICAL EXAMINATION: ECOG PERFORMANCE STATUS: 0 - Asymptomatic  Vitals:   09/19/16 0838  BP:  (!) 142/92  Pulse: 82  Resp: 18  Temp: 97.9 F (36.6 C)   Filed Weights   09/19/16 0838  Weight: 168 lb 1.6 oz (76.2 kg)    GENERAL:alert, no distress and comfortable SKIN: skin color, texture, turgor are normal, no rashes or significant lesions EYES: normal, conjunctiva are pink and non-injected, sclera clear OROPHARYNX:no exudate, no erythema and lips, buccal mucosa, and tongue normal  NECK: supple, thyroid normal size, non-tender, without nodularity LYMPH:  no palpable lymphadenopathy in the cervical, axillary or inguinal LUNGS: clear to auscultation and percussion with normal breathing effort HEART: regular rate & rhythm and no murmurs and no lower extremity edema ABDOMEN:abdomen soft, non-tender and normal bowel sounds Musculoskeletal:no cyanosis of digits and no clubbing  PSYCH: alert & oriented x 3 with fluent speech NEURO: no focal motor/sensory deficits BREAST: No palpable nodules in breast. No palpable axillary or supraclavicular lymphadenopathy (exam performed in the presence of a chaperone)   LABORATORY DATA:  I have reviewed the data as listed Lab Results  Component Value Date   WBC 5.5 09/19/2016   HGB 14.4 09/19/2016   HCT 42.7 09/19/2016   MCV 94.5 09/19/2016   PLT 254 09/19/2016   Lab Results  Component Value Date   NA 138 09/19/2016   K 3.9 09/19/2016   CO2 27 09/19/2016    RADIOGRAPHIC STUDIES: I have personally reviewed the radiological reports and agreed with the findings in the report.  ASSESSMENT AND PLAN:  Malignant neoplasm of upper-inner quadrant of left breast in female, estrogen receptor positive ([place]) 09/13/2016: Left breast biopsy UIQ: Grade 1 invasive ductal carcinoma ER 95%, PR 90%, Ki-67 15%, HER-2 negative ratio 1.41; screening detected left breast mass at 11:30 position 6 mm size, axilla negative, T1 BN 0 stage IA clinical stage  Pathology and radiology counseling:Discussed with the patient, the details of pathology including the  type of breast cancer,the clinical staging, the significance of ER, PR and HER-2/neu receptors and the implications for treatment. After reviewing the pathology in detail, we proceeded to discuss the different treatment options between surgery, radiation, chemotherapy, antiestrogen therapies.  Recommendations: 1. Breast conserving surgery followed by 2. Oncotype DX testing to determine if chemotherapy would be of any benefit (Based on final tumor size) 3. Adjuvant radiation therapy followed by 4. Adjuvant antiestrogen therapy  Oncotype counseling: I discussed Oncotype DX test. I explained to the patient that this is a 21 gene panel to evaluate patient tumors DNA to calculate recurrence score. This would help determine whether patient has high risk or intermediate risk or low risk breast cancer. She understands that if her tumor was found to be high risk, she would benefit from systemic chemotherapy. If low risk, no need of chemotherapy. If she was found to be intermediate risk, we would need to evaluate the score as well as other risk factors and determine if an abbreviated chemotherapy may be of benefit.  Return to clinic after surgery to discuss final pathology report and then determine if Oncotype DX testing  will need to be sent.   All questions were answered. The patient knows to call the clinic with any problems, questions or concerns.    Rulon Eisenmenger, MD 09/19/16

## 2016-09-24 ENCOUNTER — Encounter (HOSPITAL_BASED_OUTPATIENT_CLINIC_OR_DEPARTMENT_OTHER): Payer: Self-pay | Admitting: *Deleted

## 2016-09-24 ENCOUNTER — Other Ambulatory Visit: Payer: Self-pay

## 2016-09-24 ENCOUNTER — Telehealth: Payer: Self-pay | Admitting: *Deleted

## 2016-09-24 ENCOUNTER — Encounter (HOSPITAL_BASED_OUTPATIENT_CLINIC_OR_DEPARTMENT_OTHER)
Admission: RE | Admit: 2016-09-24 | Discharge: 2016-09-24 | Disposition: A | Discharge status: Home / Self Care | Payer: No Typology Code available for payment source | Source: Ambulatory Visit | Attending: General Surgery | Admitting: General Surgery

## 2016-09-24 DIAGNOSIS — I10 Essential (primary) hypertension: Secondary | ICD-10-CM

## 2016-09-24 DIAGNOSIS — Z9842 Cataract extraction status, left eye: Secondary | ICD-10-CM | POA: Diagnosis not present

## 2016-09-24 DIAGNOSIS — Z9841 Cataract extraction status, right eye: Secondary | ICD-10-CM | POA: Diagnosis not present

## 2016-09-24 DIAGNOSIS — Z833 Family history of diabetes mellitus: Secondary | ICD-10-CM | POA: Diagnosis not present

## 2016-09-24 DIAGNOSIS — Z803 Family history of malignant neoplasm of breast: Secondary | ICD-10-CM | POA: Diagnosis not present

## 2016-09-24 DIAGNOSIS — Z8041 Family history of malignant neoplasm of ovary: Secondary | ICD-10-CM | POA: Diagnosis not present

## 2016-09-24 DIAGNOSIS — Z823 Family history of stroke: Secondary | ICD-10-CM | POA: Diagnosis not present

## 2016-09-24 DIAGNOSIS — Z8049 Family history of malignant neoplasm of other genital organs: Secondary | ICD-10-CM | POA: Diagnosis not present

## 2016-09-24 DIAGNOSIS — Z836 Family history of other diseases of the respiratory system: Secondary | ICD-10-CM | POA: Diagnosis not present

## 2016-09-24 DIAGNOSIS — Z170 Estrogen receptor positive status [ER+]: Secondary | ICD-10-CM | POA: Diagnosis not present

## 2016-09-24 DIAGNOSIS — C50212 Malignant neoplasm of upper-inner quadrant of left female breast: Secondary | ICD-10-CM | POA: Diagnosis not present

## 2016-09-24 DIAGNOSIS — M1990 Unspecified osteoarthritis, unspecified site: Secondary | ICD-10-CM | POA: Diagnosis not present

## 2016-09-24 DIAGNOSIS — Z8249 Family history of ischemic heart disease and other diseases of the circulatory system: Secondary | ICD-10-CM | POA: Diagnosis not present

## 2016-09-24 DIAGNOSIS — Z8261 Family history of arthritis: Secondary | ICD-10-CM | POA: Diagnosis not present

## 2016-09-24 NOTE — Telephone Encounter (Signed)
  Oncology Nurse Navigator Documentation  Navigator Location: [facility] (09/24/16 1300)   )Navigator Encounter Type: Telephone (09/24/16 1300) Telephone: Outgoing Call (09/24/16 1300)     Surgery Date: 09/27/16 (09/24/16 1300) Genetic Counseling Date:  (None) (09/24/16 1300) Genetic Counseling Type:  (None) (09/24/16 1300) Plastic Surgery Consult Date:  (None) (09/24/16 1300)     Treatment Initiated Date: 09/27/16 (09/24/16 1300)     Barriers/Navigation Needs: Coordination of Care (09/24/16 1300)   Interventions: Coordination of Care (09/24/16 1300)  Left vm regarding [facility] from 09/19/16. Contact information provided. Inbasket sent for post-op appt with Dr. Lindi Adie Sx scheduled for 11/30                    Time Spent with Patient: 15 (09/24/16 1300)

## 2016-09-25 ENCOUNTER — Other Ambulatory Visit: Payer: Self-pay | Admitting: General Surgery

## 2016-09-25 ENCOUNTER — Ambulatory Visit
Admission: RE | Admit: 2016-09-25 | Discharge: 2016-09-25 | Disposition: A | Discharge status: Home / Self Care | Payer: No Typology Code available for payment source | Source: Ambulatory Visit | Attending: General Surgery | Admitting: General Surgery

## 2016-09-25 DIAGNOSIS — C50212 Malignant neoplasm of upper-inner quadrant of left female breast: Secondary | ICD-10-CM

## 2016-09-25 DIAGNOSIS — Z170 Estrogen receptor positive status [ER+]: Principal | ICD-10-CM

## 2016-09-26 ENCOUNTER — Telehealth: Payer: Self-pay | Admitting: Hematology and Oncology

## 2016-09-26 NOTE — Telephone Encounter (Signed)
lvm to inform pt of 12/7 appt date/time per LOS

## 2016-09-27 ENCOUNTER — Ambulatory Visit
Admission: RE | Admit: 2016-09-27 | Discharge: 2016-09-27 | Disposition: A | Discharge status: Home / Self Care | Payer: No Typology Code available for payment source | Source: Ambulatory Visit | Attending: General Surgery | Admitting: General Surgery

## 2016-09-27 ENCOUNTER — Encounter (HOSPITAL_COMMUNITY)
Admission: RE | Admit: 2016-09-27 | Discharge: 2016-09-27 | Disposition: A | Discharge status: Home / Self Care | Payer: No Typology Code available for payment source | Source: Ambulatory Visit | Attending: General Surgery | Admitting: General Surgery

## 2016-09-27 ENCOUNTER — Ambulatory Visit (HOSPITAL_BASED_OUTPATIENT_CLINIC_OR_DEPARTMENT_OTHER): Payer: No Typology Code available for payment source | Admitting: Anesthesiology

## 2016-09-27 ENCOUNTER — Encounter (HOSPITAL_BASED_OUTPATIENT_CLINIC_OR_DEPARTMENT_OTHER)
Admission: RE | Disposition: A | Discharge status: Home / Self Care | Payer: Self-pay | Source: Ambulatory Visit | Attending: General Surgery

## 2016-09-27 ENCOUNTER — Ambulatory Visit (HOSPITAL_BASED_OUTPATIENT_CLINIC_OR_DEPARTMENT_OTHER)
Admission: RE | Admit: 2016-09-27 | Discharge: 2016-09-27 | Disposition: A | Discharge status: Home / Self Care | Payer: No Typology Code available for payment source | Source: Ambulatory Visit | Attending: General Surgery | Admitting: General Surgery

## 2016-09-27 ENCOUNTER — Encounter (HOSPITAL_BASED_OUTPATIENT_CLINIC_OR_DEPARTMENT_OTHER): Payer: Self-pay | Admitting: Anesthesiology

## 2016-09-27 DIAGNOSIS — C50212 Malignant neoplasm of upper-inner quadrant of left female breast: Secondary | ICD-10-CM

## 2016-09-27 DIAGNOSIS — Z170 Estrogen receptor positive status [ER+]: Principal | ICD-10-CM

## 2016-09-27 DIAGNOSIS — Z803 Family history of malignant neoplasm of breast: Secondary | ICD-10-CM | POA: Insufficient documentation

## 2016-09-27 DIAGNOSIS — Z8049 Family history of malignant neoplasm of other genital organs: Secondary | ICD-10-CM | POA: Insufficient documentation

## 2016-09-27 DIAGNOSIS — Z8261 Family history of arthritis: Secondary | ICD-10-CM | POA: Insufficient documentation

## 2016-09-27 DIAGNOSIS — Z8041 Family history of malignant neoplasm of ovary: Secondary | ICD-10-CM | POA: Insufficient documentation

## 2016-09-27 DIAGNOSIS — M1990 Unspecified osteoarthritis, unspecified site: Secondary | ICD-10-CM | POA: Insufficient documentation

## 2016-09-27 DIAGNOSIS — I10 Essential (primary) hypertension: Secondary | ICD-10-CM | POA: Insufficient documentation

## 2016-09-27 DIAGNOSIS — Z9841 Cataract extraction status, right eye: Secondary | ICD-10-CM | POA: Insufficient documentation

## 2016-09-27 DIAGNOSIS — Z9842 Cataract extraction status, left eye: Secondary | ICD-10-CM | POA: Insufficient documentation

## 2016-09-27 DIAGNOSIS — Z8249 Family history of ischemic heart disease and other diseases of the circulatory system: Secondary | ICD-10-CM | POA: Insufficient documentation

## 2016-09-27 DIAGNOSIS — Z823 Family history of stroke: Secondary | ICD-10-CM | POA: Insufficient documentation

## 2016-09-27 DIAGNOSIS — Z836 Family history of other diseases of the respiratory system: Secondary | ICD-10-CM | POA: Insufficient documentation

## 2016-09-27 DIAGNOSIS — Z833 Family history of diabetes mellitus: Secondary | ICD-10-CM | POA: Insufficient documentation

## 2016-09-27 HISTORY — DX: Other complications of anesthesia, initial encounter: T88.59XA

## 2016-09-27 HISTORY — PX: BREAST LUMPECTOMY WITH RADIOACTIVE SEED AND SENTINEL LYMPH NODE BIOPSY: SHX6550

## 2016-09-27 HISTORY — DX: Malignant (primary) neoplasm, unspecified: C80.1

## 2016-09-27 HISTORY — DX: Adverse effect of unspecified anesthetic, initial encounter: T41.45XA

## 2016-09-27 SURGERY — BREAST LUMPECTOMY WITH RADIOACTIVE SEED AND SENTINEL LYMPH NODE BIOPSY
Anesthesia: General | Site: Breast | Laterality: Left

## 2016-09-27 MED ORDER — PROPOFOL 10 MG/ML IV BOLUS
INTRAVENOUS | Status: DC | PRN
  Administered 2016-09-27: 200 mg via INTRAVENOUS

## 2016-09-27 MED ORDER — FENTANYL CITRATE (PF) 100 MCG/2ML IJ SOLN
25.0000 ug | INTRAMUSCULAR | Status: DC | PRN
  Administered 2016-09-27 (×3): 25 ug via INTRAVENOUS

## 2016-09-27 MED ORDER — FENTANYL CITRATE (PF) 100 MCG/2ML IJ SOLN
INTRAMUSCULAR | Status: AC
  Filled 2016-09-27: qty 2

## 2016-09-27 MED ORDER — HYDROCODONE-ACETAMINOPHEN 5-325 MG PO TABS: 1.0000 | ORAL_TABLET | ORAL | 0 refills | Status: DC | PRN

## 2016-09-27 MED ORDER — MEPERIDINE HCL 25 MG/ML IJ SOLN: 6.2500 mg | INTRAMUSCULAR | Status: DC | PRN

## 2016-09-27 MED ORDER — MIDAZOLAM HCL 2 MG/2ML IJ SOLN
INTRAMUSCULAR | Status: AC
  Filled 2016-09-27: qty 2

## 2016-09-27 MED ORDER — MIDAZOLAM HCL 2 MG/2ML IJ SOLN
1.0000 mg | INTRAMUSCULAR | Status: DC | PRN
  Administered 2016-09-27 (×2): 2 mg via INTRAVENOUS

## 2016-09-27 MED ORDER — DEXAMETHASONE SODIUM PHOSPHATE 4 MG/ML IJ SOLN
INTRAMUSCULAR | Status: DC | PRN
  Administered 2016-09-27: 10 mg via INTRAVENOUS

## 2016-09-27 MED ORDER — BUPIVACAINE HCL (PF) 0.25 % IJ SOLN
INTRAMUSCULAR | Status: DC | PRN
  Administered 2016-09-27: 18 mL

## 2016-09-27 MED ORDER — CHLORHEXIDINE GLUCONATE CLOTH 2 % EX PADS: 6.0000 | MEDICATED_PAD | Freq: Once | CUTANEOUS | Status: DC

## 2016-09-27 MED ORDER — METHYLENE BLUE 0.5 % INJ SOLN
INTRAVENOUS | Status: AC
  Filled 2016-09-27: qty 10

## 2016-09-27 MED ORDER — HYDROCODONE-ACETAMINOPHEN 5-325 MG PO TABS: 1.0000 | ORAL_TABLET | Freq: Once | ORAL | Status: DC

## 2016-09-27 MED ORDER — CEFAZOLIN SODIUM-DEXTROSE 2-4 GM/100ML-% IV SOLN
2.0000 g | INTRAVENOUS | Status: AC
  Administered 2016-09-27: 2 g via INTRAVENOUS

## 2016-09-27 MED ORDER — LIDOCAINE 2% (20 MG/ML) 5 ML SYRINGE
INTRAMUSCULAR | Status: DC | PRN
  Administered 2016-09-27: 50 mg via INTRAVENOUS

## 2016-09-27 MED ORDER — CEFAZOLIN SODIUM-DEXTROSE 2-4 GM/100ML-% IV SOLN
INTRAVENOUS | Status: AC
  Filled 2016-09-27: qty 100

## 2016-09-27 MED ORDER — HYDROCODONE-ACETAMINOPHEN 5-325 MG PO TABS
1.0000 | ORAL_TABLET | Freq: Once | ORAL | Status: AC
  Administered 2016-09-27: 2 via ORAL

## 2016-09-27 MED ORDER — FENTANYL CITRATE (PF) 100 MCG/2ML IJ SOLN
50.0000 ug | INTRAMUSCULAR | Status: AC | PRN
  Administered 2016-09-27: 50 ug via INTRAVENOUS
  Administered 2016-09-27: 100 ug via INTRAVENOUS
  Administered 2016-09-27: 50 ug via INTRAVENOUS

## 2016-09-27 MED ORDER — SODIUM CHLORIDE 0.9 % IJ SOLN
INTRAMUSCULAR | Status: AC
  Filled 2016-09-27: qty 10

## 2016-09-27 MED ORDER — TRAMADOL HCL 50 MG PO TABS: 50.0000 mg | ORAL_TABLET | Freq: Four times a day (QID) | ORAL | 0 refills | Status: DC | PRN

## 2016-09-27 MED ORDER — PROMETHAZINE HCL 25 MG/ML IJ SOLN: 6.2500 mg | INTRAMUSCULAR | Status: DC | PRN

## 2016-09-27 MED ORDER — BUPIVACAINE-EPINEPHRINE (PF) 0.5% -1:200000 IJ SOLN
INTRAMUSCULAR | Status: DC | PRN
  Administered 2016-09-27: 30 mL

## 2016-09-27 MED ORDER — DEXAMETHASONE SODIUM PHOSPHATE 10 MG/ML IJ SOLN
INTRAMUSCULAR | Status: AC
  Filled 2016-09-27: qty 1

## 2016-09-27 MED ORDER — HYDROCODONE-ACETAMINOPHEN 5-325 MG PO TABS
ORAL_TABLET | ORAL | Status: AC
  Filled 2016-09-27: qty 2

## 2016-09-27 MED ORDER — KETOROLAC TROMETHAMINE 30 MG/ML IJ SOLN
INTRAMUSCULAR | Status: AC
  Filled 2016-09-27: qty 1

## 2016-09-27 MED ORDER — TECHNETIUM TC 99M SULFUR COLLOID FILTERED
1.0000 | Freq: Once | INTRAVENOUS | Status: AC | PRN
  Administered 2016-09-27: 1 via INTRADERMAL

## 2016-09-27 MED ORDER — LIDOCAINE 2% (20 MG/ML) 5 ML SYRINGE
INTRAMUSCULAR | Status: AC
  Filled 2016-09-27: qty 5

## 2016-09-27 MED ORDER — ONDANSETRON HCL 4 MG/2ML IJ SOLN
INTRAMUSCULAR | Status: AC
  Filled 2016-09-27: qty 2

## 2016-09-27 MED ORDER — SCOPOLAMINE 1 MG/3DAYS TD PT72: 1.0000 | MEDICATED_PATCH | Freq: Once | TRANSDERMAL | Status: DC | PRN

## 2016-09-27 MED ORDER — MIDAZOLAM HCL 2 MG/2ML IJ SOLN: 0.5000 mg | Freq: Once | INTRAMUSCULAR | Status: DC | PRN

## 2016-09-27 MED ORDER — LACTATED RINGERS IV SOLN
INTRAVENOUS | Status: DC
  Administered 2016-09-27: 14:00:00 via INTRAVENOUS

## 2016-09-27 MED ORDER — FENTANYL CITRATE (PF) 100 MCG/2ML IJ SOLN
INTRAMUSCULAR | Status: AC
Start: 2016-09-27 — End: 2016-09-27
  Filled 2016-09-27: qty 2

## 2016-09-27 MED ORDER — ONDANSETRON HCL 4 MG/2ML IJ SOLN
INTRAMUSCULAR | Status: DC | PRN
  Administered 2016-09-27: 4 mg via INTRAVENOUS

## 2016-09-27 SURGICAL SUPPLY — 52 items
APPLIER CLIP 9.375 MED OPEN (MISCELLANEOUS) ×3
BINDER BREAST LRG (GAUZE/BANDAGES/DRESSINGS) ×3 IMPLANT
BLADE SURG 15 STRL LF DISP TIS (BLADE) ×1 IMPLANT
BLADE SURG 15 STRL SS (BLADE) ×2
CANISTER SUC SOCK COL 7IN (MISCELLANEOUS) IMPLANT
CANISTER SUCT 1200ML W/VALVE (MISCELLANEOUS) ×3 IMPLANT
CHLORAPREP W/TINT 26ML (MISCELLANEOUS) ×3 IMPLANT
CLIP APPLIE 9.375 MED OPEN (MISCELLANEOUS) ×1 IMPLANT
COVER BACK TABLE 60X90IN (DRAPES) ×3 IMPLANT
COVER MAYO STAND STRL (DRAPES) ×3 IMPLANT
COVER PROBE W GEL 5X96 (DRAPES) ×3 IMPLANT
DECANTER SPIKE VIAL GLASS SM (MISCELLANEOUS) IMPLANT
DERMABOND ADVANCED (GAUZE/BANDAGES/DRESSINGS) ×2
DERMABOND ADVANCED .7 DNX12 (GAUZE/BANDAGES/DRESSINGS) ×1 IMPLANT
DEVICE DUBIN W/COMP PLATE 8390 (MISCELLANEOUS) ×3 IMPLANT
DRAPE LAPAROSCOPIC ABDOMINAL (DRAPES) ×3 IMPLANT
DRAPE UTILITY XL STRL (DRAPES) ×3 IMPLANT
ELECT COATED BLADE 2.86 ST (ELECTRODE) ×3 IMPLANT
ELECT REM PT RETURN 9FT ADLT (ELECTROSURGICAL) ×3
ELECTRODE REM PT RTRN 9FT ADLT (ELECTROSURGICAL) ×1 IMPLANT
GLOVE BIO SURGEON STRL SZ7.5 (GLOVE) ×6 IMPLANT
GLOVE BIOGEL PI IND STRL 7.0 (GLOVE) ×1 IMPLANT
GLOVE BIOGEL PI IND STRL 8 (GLOVE) ×1 IMPLANT
GLOVE BIOGEL PI INDICATOR 7.0 (GLOVE) ×2
GLOVE BIOGEL PI INDICATOR 8 (GLOVE) ×2
GLOVE ECLIPSE 6.5 STRL STRAW (GLOVE) ×3 IMPLANT
GLOVE EXAM NITRILE EXT CFF LRG (GLOVE) ×3 IMPLANT
GLOVE SURG SYN 7.5  E (GLOVE) ×2
GLOVE SURG SYN 7.5 E (GLOVE) ×1 IMPLANT
GOWN STRL REUS W/ TWL LRG LVL3 (GOWN DISPOSABLE) ×2 IMPLANT
GOWN STRL REUS W/TWL 2XL LVL3 (GOWN DISPOSABLE) ×3 IMPLANT
GOWN STRL REUS W/TWL LRG LVL3 (GOWN DISPOSABLE) ×4
ILLUMINATOR WAVEGUIDE N/F (MISCELLANEOUS) IMPLANT
KIT MARKER MARGIN INK (KITS) ×3 IMPLANT
LIGHT WAVEGUIDE WIDE FLAT (MISCELLANEOUS) IMPLANT
NDL SAFETY ECLIPSE 18X1.5 (NEEDLE) IMPLANT
NEEDLE HYPO 18GX1.5 SHARP (NEEDLE)
NEEDLE HYPO 25X1 1.5 SAFETY (NEEDLE) ×3 IMPLANT
NS IRRIG 1000ML POUR BTL (IV SOLUTION) ×3 IMPLANT
PACK BASIN DAY SURGERY FS (CUSTOM PROCEDURE TRAY) ×3 IMPLANT
PENCIL BUTTON HOLSTER BLD 10FT (ELECTRODE) ×3 IMPLANT
SLEEVE SCD COMPRESS KNEE MED (MISCELLANEOUS) ×3 IMPLANT
SPONGE LAP 18X18 X RAY DECT (DISPOSABLE) ×3 IMPLANT
SUT MON AB 4-0 PC3 18 (SUTURE) ×6 IMPLANT
SUT SILK 2 0 SH (SUTURE) IMPLANT
SUT VICRYL 3-0 CR8 SH (SUTURE) ×3 IMPLANT
SYR CONTROL 10ML LL (SYRINGE) ×3 IMPLANT
TOWEL OR 17X24 6PK STRL BLUE (TOWEL DISPOSABLE) ×3 IMPLANT
TOWEL OR NON WOVEN STRL DISP B (DISPOSABLE) ×3 IMPLANT
TUBE CONNECTING 20'X1/4 (TUBING) ×1
TUBE CONNECTING 20X1/4 (TUBING) ×2 IMPLANT
YANKAUER SUCT BULB TIP NO VENT (SUCTIONS) ×3 IMPLANT

## 2016-09-27 NOTE — Anesthesia Procedure Notes (Signed)
Performed by: Darey Hershberger W       

## 2016-09-27 NOTE — Anesthesia Postprocedure Evaluation (Signed)
Anesthesia Post Note  Patient: Bonnie Marquez  Procedure(s) Performed: Procedure(s) (LRB): LEFT BREAST LUMPECTOMY WITH TWO  RADIOACTIVE SEEDS AND SENTINEL LYMPH NODE BIOPSY (Left)  Patient location during evaluation: PACU Anesthesia Type: General and Regional Level of consciousness: awake and alert, oriented and patient cooperative Pain management: pain level controlled Vital Signs Assessment: post-procedure vital signs reviewed and stable Respiratory status: spontaneous breathing, nonlabored ventilation and respiratory function stable Cardiovascular status: blood pressure returned to baseline and stable Postop Assessment: no signs of nausea or vomiting Anesthetic complications: no    Last Vitals:  Vitals:   09/27/16 1800 09/27/16 1815  BP: (!) 142/100 (!) 148/91  Pulse: 77 78  Resp: 15 (!) 27  Temp:      Last Pain:  Vitals:   09/27/16 1815  TempSrc:   PainSc: 3                  Charlee Whitebread,E. Hessie Varone

## 2016-09-27 NOTE — Transfer of Care (Signed)
Immediate Anesthesia Transfer of Care Note  Patient: Bonnie Marquez  Procedure(s) Performed: Procedure(s): LEFT BREAST LUMPECTOMY WITH TWO  RADIOACTIVE SEEDS AND SENTINEL LYMPH NODE BIOPSY (Left)  Patient Location: PACU  Anesthesia Type:General  Level of Consciousness: awake and sedated  Airway & Oxygen Therapy: Patient Spontanous Breathing and Patient connected to face mask oxygen  Post-op Assessment: Report given to RN and Post -op Vital signs reviewed and stable  Post vital signs: Reviewed and stable  Last Vitals:  Vitals:   09/27/16 1515 09/27/16 1530  BP:    Pulse: 70 84  Resp: (!) 23 (!) 21  Temp:      Last Pain:  Vitals:   09/27/16 1321  TempSrc: Oral  PainSc: 2          Complications: No apparent anesthesia complications

## 2016-09-27 NOTE — Discharge Instructions (Signed)

## 2016-09-27 NOTE — Op Note (Addendum)
09/27/2016  5:12 PM  PATIENT:  Bonnie Marquez  55 y.o. female  PRE-OPERATIVE DIAGNOSIS:  LEFT BREAST CANCER  POST-OPERATIVE DIAGNOSIS:  LEFT BREAST CANCER  PROCEDURE:  Procedure(s): LEFT BREAST RADIOACTIVE SEED LOCALIZED LUMPECTOMY WITH TWO  RADIOACTIVE SEEDS AND SENTINEL LYMPH NODE BIOPSY (Left)  SURGEON:  Surgeon(s) and Role:    * Jovita Kussmaul, MD - Primary  PHYSICIAN ASSISTANT:   ASSISTANTS: none   ANESTHESIA:   local and general  EBL:  Total I/O In: 1400 [I.V.:1400] Out: 50 [Blood:50]  BLOOD ADMINISTERED:none  DRAINS: none   LOCAL MEDICATIONS USED:  MARCAINE     SPECIMEN:  Source of Specimen:  left breast tissue and sentinel nodes X 3  DISPOSITION OF SPECIMEN:  PATHOLOGY  COUNTS:  YES  TOURNIQUET:  * No tourniquets in log *  DICTATION: .Dragon Dictation   After informed consent was obtained the patient was brought to the operating room and placed in the supine position on the operating room table. After adequate induction of general anesthesia the patient's left chest, breast, and axillary area were prepped with ChloraPrep, allowed to dry, and draped in usual sterile manner. An appropriate timeout was performed. Previously 2 I-125 seeds were placed in the upper portion of the left breast to mark areas of invasive breast cancer. Earlier in the day the patient underwent injection of 1 mCi of technetium sulfur colloid in the subareolar position on the left breast. Attention was first turned to the left axilla. The neoprobe was set to technetium. A hot spot was readily identified. A small transversely oriented incision was made overlying the hot spot with a 15 blade knife. The incision was carried through the skin and subcutaneous tissue sharply with the electrocautery until the axilla was entered. The neoprobe was used to direct blunt hemostat dissection of the deep axillary lymph nodes until 3 hot lymph nodes were identified. Each was excised sharply with the  electrocautery and the lymphatics were controlled with clips. Ex vivo counts on all 3 nodes were approximately 200. No other hot or palpable lymph nodes were identified in the left axilla. The area was infiltrated with quarter percent Marcaine. The deep layer of the wound was closed with interrupted 3-0 Vicryl stitches. The skin was then closed with a running 4-0 Monocryl subcuticular stitch. Attention was then turned to the left breast. The neoprobe was set to I-125. The area of radioactivity was readily identified in the upper portion of the left breast. A curvilinear incision was made with a 15 blade knife overlying the area of radioactivity. The incision was carried through the skin and subcutaneous tissue sharply with the electrocautery. While checking the area of radioactivity frequently a circular portion of breast tissue was excised sharply around the 2 radioactive seeds. The dissection was carried all the way to the chest wall. Once the specimen was removed it was oriented with the appropriate paint colors. A specimen radiograph was obtained that showed the clips and seeds to be near the center of the specimen. The specimen was then sent to pathology for further evaluation. Several small vessels within the breast were controlled with clips. The cavity was also marked with clips. The wound was irrigated with copious amounts of saline and infiltrated with quarter percent Marcaine. The incision was then closed with layers of interrupted 3-0 Vicryl stitches. The skin was then closed with interrupted 4-0 Monocryl subcuticular stitches. Dermabond dressings were applied. The patient tolerated the procedure well. At the end of the case all needle  sponge and instrument counts were correct. The patient was then awakened and taken to recovery in stable condition.  PLAN OF CARE: Discharge to home after PACU  PATIENT DISPOSITION:  PACU - hemodynamically stable.   Delay start of Pharmacological VTE agent (>24hrs)  due to surgical blood loss or risk of bleeding: not applicable

## 2016-09-27 NOTE — H&P (Signed)
Bonnie Marquez  Location: [facility] Surgery Patient #: [id number] DOB: [birth year] Undefined / Language: Cleophus Molt / Race: White Female   History of Present Illness  The patient is a 55 year old female who presents with breast cancer. We are asked to see the patient in consultation by Dr. Sondra Come to evaluate her for a new left breast cancer. The patient is a 56 year old white female who recently went for a routine screening mammogram. She had a 5m mass in the upper inner quadrant of the left breast. It was a grade I invasive ductal cancer that was ER and PR + and Her2 - with a Ki67 of 15%. She has a history of breast cancer in a paternal grandmother and uterine cancer in her mother. She does not smoke or take hormone replacement.   Other Problems Arthritis Breast Cancer General anesthesia - complications High blood pressure Lump In Breast  Past Surgical History  Appendectomy Breast Biopsy Left. multiple Cataract Surgery Bilateral. Knee Surgery Left.  Diagnostic Studies History  Colonoscopy 1-5 years ago Mammogram within last year Pap Smear 1-5 years ago  Medication History Medications Reconciled  Social History  Alcohol use Occasional alcohol use. Caffeine use Carbonated beverages, Coffee. No drug use Tobacco use Never smoker.  Family History Arthritis Mother. Breast Cancer Family Members In General. Cerebrovascular Accident Brother. Cervical Cancer Mother. Diabetes Mellitus Brother, Mother. Hypertension Brother, Family Members In [place] Mother, Sister. Ovarian Cancer Mother. Respiratory Condition Father.  Pregnancy / Birth History  Age at menarche 16years. Age of menopause [age]Contraceptive History Oral contraceptives. Gravida 4 Irregular periods Maternal age 549-35Para 2    Review of Systems  General Present- Fatigue. Not Present- Appetite Loss, Chills, Fever, Night Sweats, Weight Gain and Weight Loss. HEENT  Present- Wears glasses/contact lenses. Not Present- Earache, Hearing Loss, Hoarseness, Nose Bleed, Oral Ulcers, Ringing in the Ears, Seasonal Allergies, Sinus Pain, Sore Throat, Visual Disturbances and Yellow Eyes. Respiratory Present- Chronic Cough and Snoring. Not Present- Bloody sputum, Difficulty Breathing and Wheezing. Breast Present- Breast Pain. Not Present- Breast Mass, Nipple Discharge and Skin Changes. Cardiovascular Not Present- Chest Pain, Difficulty Breathing Lying Down, Leg Cramps, Palpitations, Rapid Heart Rate, Shortness of Breath and Swelling of Extremities. Gastrointestinal Present- Gets full quickly at meals. Not Present- Abdominal Pain, Bloating, Bloody Stool, Change in Bowel Habits, Chronic diarrhea, Constipation, Difficulty Swallowing, Excessive gas, Hemorrhoids, Indigestion, Nausea, Rectal Pain and Vomiting. Female Genitourinary Not Present- Frequency, Nocturia, Painful Urination, Pelvic Pain and Urgency. Musculoskeletal Not Present- Back Pain, Joint Pain, Joint Stiffness, Muscle Pain, Muscle Weakness and Swelling of Extremities. Neurological Not Present- Decreased Memory, Fainting, Headaches, Numbness, Seizures, Tingling, Tremor, Trouble walking and Weakness. Psychiatric Not Present- Anxiety, Bipolar, Change in Sleep Pattern, Depression, Fearful and Frequent crying. Endocrine Present- Hot flashes. Not Present- Cold Intolerance, Excessive Hunger, Hair Changes, Heat Intolerance and New Diabetes. Hematology Present- Easy Bruising. Not Present- Blood Thinners, Excessive bleeding, Gland problems, HIV and Persistent Infections.   Physical Exam General Mental Status-Alert. General Appearance-Consistent with stated age. Hydration-Well hydrated. Voice-Normal.  Head and Neck Head-normocephalic, atraumatic with no lesions or palpable masses. Trachea-midline. Thyroid Gland Characteristics - normal size and consistency.  Eye Eyeball - Bilateral-Extraocular  movements intact. Sclera/Conjunctiva - Bilateral-No scleral icterus.  Chest and Lung Exam Chest and lung exam reveals -quiet, even and easy respiratory effort with no use of accessory muscles and on auscultation, normal breath sounds, no adventitious sounds and normal vocal resonance. Inspection Chest Wall - Normal. Back - normal.  Breast  Note: There is no palpable mass in either breast. There is no palpable axillary, supraclavicular, or cervical lymphadenopathy   Cardiovascular Cardiovascular examination reveals -normal heart sounds, regular rate and rhythm with no murmurs and normal pedal pulses bilaterally.  Abdomen Inspection Inspection of the abdomen reveals - No Hernias. Skin - Scar - no surgical scars. Palpation/Percussion Palpation and Percussion of the abdomen reveal - Soft, Non Tender, No Rebound tenderness, No Rigidity (guarding) and No hepatosplenomegaly. Auscultation Auscultation of the abdomen reveals - Bowel sounds normal.  Neurologic Neurologic evaluation reveals -alert and oriented x 3 with no impairment of recent or remote memory. Mental Status-Normal.  Musculoskeletal Normal Exam - Left-Upper Extremity Strength Normal and Lower Extremity Strength Normal. Normal Exam - Right-Upper Extremity Strength Normal and Lower Extremity Strength Normal.  Lymphatic Head & Neck  General Head & Neck Lymphatics: Bilateral - Description - Normal. Axillary  General Axillary Region: Bilateral - Description - Normal. Tenderness - Non Tender. Femoral & Inguinal  Generalized Femoral & Inguinal Lymphatics: Bilateral - Description - Normal. Tenderness - Non Tender.    Assessment & Plan  MALIGNANT NEOPLASM OF UPPER-INNER QUADRANT OF LEFT BREAST IN FEMALE, ESTROGEN RECEPTOR POSITIVE (C50.212) Impression: The patient appears to have a small stage I cancer in the upper inner left breast. I have discussed with her in detail the options for treatment and at this  point she favors breast conservation. She is a good candidate for sentinel node mapping. she will need a radioactive seed localization. I have discussed with her in detail the risks and benefits of the surgery as well as some of the technical aspects and she understands and wishes to proceed

## 2016-09-27 NOTE — Anesthesia Procedure Notes (Signed)
Procedure Name: LMA Insertion Performed by: Justise Ehmann W Pre-anesthesia Checklist: Patient identified, Emergency Drugs available, Suction available and Patient being monitored Patient Re-evaluated:Patient Re-evaluated prior to inductionOxygen Delivery Method: Circle system utilized Preoxygenation: Pre-oxygenation with 100% oxygen Intubation Type: IV induction Ventilation: Mask ventilation without difficulty LMA: LMA inserted LMA Size: 4.0 Number of attempts: 1 Placement Confirmation: positive ETCO2 Tube secured with: Tape Dental Injury: Teeth and Oropharynx as per pre-operative assessment        

## 2016-09-27 NOTE — Anesthesia Preprocedure Evaluation (Signed)
Anesthesia Evaluation  Patient identified by MRN, date of birth, ID band Patient awake    Reviewed: Allergy & Precautions, NPO status , Patient's Chart, lab work & pertinent test results  History of Anesthesia Complications (+) PROLONGED EMERGENCE and history of anesthetic complications  Airway Mallampati: II  TM Distance: >3 FB Neck ROM: Full    Dental  (+) Dental Advisory Given, Implants   Pulmonary neg pulmonary ROS,    breath sounds clear to auscultation       Cardiovascular hypertension, Pt. on medications (-) angina Rhythm:Regular Rate:Normal     Neuro/Psych negative neurological ROS     GI/Hepatic negative GI ROS, Neg liver ROS,   Endo/Other  negative endocrine ROS  Renal/GU negative Renal ROS     Musculoskeletal   Abdominal   Peds  Hematology negative hematology ROS (+)   Anesthesia Other Findings Breast cancer  Reproductive/Obstetrics                             Anesthesia Physical Anesthesia Plan  ASA: II  Anesthesia Plan: General   Post-op Pain Management: GA combined w/ Regional for post-op pain   Induction: Intravenous  Airway Management Planned: LMA  Additional Equipment:   Intra-op Plan:   Post-operative Plan:   Informed Consent: I have reviewed the patients History and Physical, chart, labs and discussed the procedure including the risks, benefits and alternatives for the proposed anesthesia with the patient or authorized representative who has indicated his/her understanding and acceptance.   Dental advisory given  Plan Discussed with: CRNA and Surgeon  Anesthesia Plan Comments: (Plan routine monitors, GA- LMA OK, pectoralis block for post op analgesia)        Anesthesia Quick Evaluation

## 2016-09-27 NOTE — Anesthesia Procedure Notes (Signed)
Anesthesia Regional Block:  Pectoralis block  Pre-Anesthetic Checklist: ,, timeout performed, Correct Patient, Correct Site, Correct Laterality, Correct Procedure, Correct Position, site marked, Risks and benefits discussed,  Surgical consent,  Pre-op evaluation,  At surgeon's request and post-op pain management  Laterality: Left and Upper  Prep: chloraprep       Needles:  Injection technique: Single-shot  Needle Type: Echogenic Needle     Needle Length: 9cm 9 cm Needle Gauge: 22 and 22 G    Additional Needles:  Procedures: ultrasound guided (picture in chart) Pectoralis block Narrative:  Start time: 09/27/2016 3:13 PM End time: 09/27/2016 3:22 PM Injection made incrementally with aspirations every 5 mL.  Performed by: Personally  Anesthesiologist: Glennon Mac, Mekaylah Klich  Additional Notes: Pt identified in Holding room.  Monitors applied. Working IV access confirmed. Sterile prep, drape L clavicle and pec.  #22ga ECHOgenic needle sub pec major between ribs 4 and 5 with US guidance.  30cc 0.5% Bupivacaine with 1:200k epi injected incrementally after negative test dose, good spread across ribs.  Patient asymptomatic, VSS, no heme aspirated, tolerated well.  Jenita Seashore, MD

## 2016-09-27 NOTE — Progress Notes (Signed)
Assisted Dr. Carswell Jackson with left, ultrasound guided, pectoralis block. Side rails up, monitors on throughout procedure. See vital signs in flow sheet. Tolerated Procedure well. 

## 2016-09-28 ENCOUNTER — Encounter (HOSPITAL_BASED_OUTPATIENT_CLINIC_OR_DEPARTMENT_OTHER): Payer: Self-pay | Admitting: General Surgery

## 2016-09-28 ENCOUNTER — Telehealth (HOSPITAL_COMMUNITY): Payer: Self-pay | Admitting: *Deleted

## 2016-09-28 NOTE — Telephone Encounter (Signed)
Telephoned patient at home number and left message to return call to [facility]

## 2016-10-01 ENCOUNTER — Encounter (HOSPITAL_COMMUNITY): Payer: Self-pay | Admitting: *Deleted

## 2016-10-01 NOTE — Progress Notes (Signed)
Patient returned call on Friday December 1. Patient stated now has insurance. Advised patient would need to check with the state to see if she would be eligible for [facility] Medicaid now that she has insurance. Telephoned state and spoke with Erma Pinto. Patient will not be eligible for [facility] Medicaid now that she has insurance.

## 2016-10-03 NOTE — Assessment & Plan Note (Signed)
Left Lumpectomy 09/27/16: IDC 2 tumors 1.2 cm and 1 cm, 0/3 LN Neg, Positive posterior margin (Muscle),  Er 95%, PR 90% Ki67: 15%, Her 2 Neg, T1bN0 Stage 1A  Pathology counseling: I discussed the final pathology report of the patient provided  a copy of this report. I discussed the margins as well as lymph node surgeries. We also discussed the final staging along with previously performed ER/PR and HER-2/neu testing.  Plan: 1. Oncotype DX testing to determine if chemotherapy would be of any benefit (Based on final tumor size) 2. Adjuvant radiation therapy followed by 3. Adjuvant antiestrogen therapy

## 2016-10-04 ENCOUNTER — Telehealth: Payer: Self-pay | Admitting: *Deleted

## 2016-10-04 ENCOUNTER — Encounter: Payer: Self-pay | Admitting: Hematology and Oncology

## 2016-10-04 ENCOUNTER — Ambulatory Visit (HOSPITAL_BASED_OUTPATIENT_CLINIC_OR_DEPARTMENT_OTHER): Payer: No Typology Code available for payment source | Admitting: Hematology and Oncology

## 2016-10-04 DIAGNOSIS — C50212 Malignant neoplasm of upper-inner quadrant of left female breast: Secondary | ICD-10-CM

## 2016-10-04 DIAGNOSIS — Z170 Estrogen receptor positive status [ER+]: Secondary | ICD-10-CM | POA: Diagnosis not present

## 2016-10-04 NOTE — Telephone Encounter (Signed)
Per Dr. Lindi Adie, ordered oncotype and faxed requisition to pathology and confirmed with [facility].

## 2016-10-04 NOTE — Progress Notes (Signed)
Patient Care Team: Vania Rea, MD as PCP - General (Obstetrics and Gynecology)  DIAGNOSIS:  Encounter Diagnosis  Name Primary?  . Malignant neoplasm of upper-inner quadrant of left breast in female, estrogen receptor positive ([place])     SUMMARY OF ONCOLOGIC HISTORY:   Malignant neoplasm of upper-inner quadrant of left breast in female, estrogen receptor positive ([place])   09/13/2016 Initial Diagnosis    Left breast biopsy UIQ: Grade 1 invasive ductal carcinoma ER 95%, PR 90%, Ki-67 15%, HER-2 negative ratio 1.41; screening detected left breast mass at 11:30 position 6 mm size, axilla negative, T1 BN 0 stage IA clinical stage      09/27/2016 Surgery    Left Lumpectomy: IDC 2 tumors 1.2 cm and 1 cm, 0/3 LN Neg, Positive posterior margin (Muscle),  Er 95%, PR 90% Ki67: 15%, Her 2 Neg, T1bN0 Stage 1A       CHIEF COMPLIANT: Follow-up to review pathology report  INTERVAL HISTORY: Bonnie Marquez is a 55 year old with above-mentioned history left breast cancer treated with lumpectomy on 09/27/2016. She is here to discuss the pathology report. She is recovering very well from the surgery. She has a positive posterior margin which happens to be muscle. So there is no role for additional surgery. She will be seen by radiation. She is recovering very well from surgery. She denies major discomfort other than soreness underneath her axilla.  REVIEW OF SYSTEMS:   Constitutional: Denies fevers, chills or abnormal weight loss Eyes: Denies blurriness of vision Ears, nose, mouth, throat, and face: Denies mucositis or sore throat Respiratory: Denies cough, dyspnea or wheezes Cardiovascular: Denies palpitation, chest discomfort Gastrointestinal:  Denies nausea, heartburn or change in bowel habits Skin: Denies abnormal skin rashes Lymphatics: Denies new lymphadenopathy or easy bruising Neurological:Denies numbness, tingling or new weaknesses Behavioral/Psych: Mood is stable, no new changes    Extremities: No lower extremity edema Breast: Left lumpectomy All other systems were reviewed with the patient and are negative.  I have reviewed the past medical history, past surgical history, social history and family history with the patient and they are unchanged from previous note.  ALLERGIES:  is allergic to codeine.  MEDICATIONS:  Current Outpatient Prescriptions  Medication Sig Dispense Refill  . Diethylpropion HCl CR 75 MG TB24 Take by mouth.    Marland Kitchen HYDROcodone-acetaminophen (NORCO/VICODIN) 5-325 MG tablet Take 1-2 tablets by mouth every 4 (four) hours as needed for moderate pain or severe pain. 20 tablet 0  . traMADol (ULTRAM) 50 MG tablet Take 1-2 tablets (50-100 mg total) by mouth every 6 (six) hours as needed. 20 tablet 0  . triamterene-hydrochlorothiazide (DYAZIDE) 37.5-25 MG capsule Take 1 capsule by mouth daily.     No current facility-administered medications for this visit.     PHYSICAL EXAMINATION: ECOG PERFORMANCE STATUS: 1 - Symptomatic but completely ambulatory  Vitals:   10/04/16 0828  BP: (!) 156/87  Pulse: 77  Resp: 20  Temp: 98.3 F (36.8 C)   Filed Weights   10/04/16 0828  Weight: 173 lb 4.8 oz (78.6 kg)    GENERAL:alert, no distress and comfortable SKIN: skin color, texture, turgor are normal, no rashes or significant lesions EYES: normal, Conjunctiva are pink and non-injected, sclera clear OROPHARYNX:no exudate, no erythema and lips, buccal mucosa, and tongue normal  NECK: supple, thyroid normal size, non-tender, without nodularity LYMPH:  no palpable lymphadenopathy in the cervical, axillary or inguinal LUNGS: clear to auscultation and percussion with normal breathing effort HEART: regular rate & rhythm and no  murmurs and no lower extremity edema ABDOMEN:abdomen soft, non-tender and normal bowel sounds MUSCULOSKELETAL:no cyanosis of digits and no clubbing  NEURO: alert & oriented x 3 with fluent speech, no focal motor/sensory  deficits EXTREMITIES: No lower extremity edema  LABORATORY DATA:  I have reviewed the data as listed   Chemistry      Component Value Date/Time   NA 138 09/19/2016 0821   K 3.9 09/19/2016 0821   CO2 27 09/19/2016 0821   BUN 15.2 09/19/2016 0821   CREATININE 0.8 09/19/2016 0821      Component Value Date/Time   CALCIUM 10.6 (H) 09/19/2016 0821   ALKPHOS 78 09/19/2016 0821   AST 50 (H) 09/19/2016 0821   ALT 57 (H) 09/19/2016 0821   BILITOT 1.28 (H) 09/19/2016 0821       Lab Results  Component Value Date   WBC 5.5 09/19/2016   HGB 14.4 09/19/2016   HCT 42.7 09/19/2016   MCV 94.5 09/19/2016   PLT 254 09/19/2016   NEUTROABS 3.2 09/19/2016    ASSESSMENT & PLAN:  Malignant neoplasm of upper-inner quadrant of left breast in female, estrogen receptor positive ([place]) Left Lumpectomy 09/27/16: IDC 2 tumors 1.2 cm and 1 cm, 0/3 LN Neg, Positive posterior margin (Muscle),  Er 95%, PR 90% Ki67: 15%, Her 2 Neg, T1bN0 Stage 1A  Pathology counseling: I discussed the final pathology report of the patient provided  a copy of this report. I discussed the margins as well as lymph node surgeries. We also discussed the final staging along with previously performed ER/PR and HER-2/neu testing.  Plan: 1. Oncotype DX testing to determine if chemotherapy would be of any benefit (Based on final tumor size) 2. Adjuvant radiation therapy followed by 3. Adjuvant antiestrogen therapy Patient's mother died 3 months ago with metastatic uterine cancer. She had gone to chemotherapy. This is on top of her mind and she is extremely anxious and worried about cancer recurrence and dying from it. No orders of the defined types were placed in this encounter.  The patient has a good understanding of the overall plan. she agrees with it. she will call with any problems that may develop before the next visit here.   Rulon Eisenmenger, MD 10/04/16

## 2016-10-15 ENCOUNTER — Telehealth: Payer: Self-pay | Admitting: *Deleted

## 2016-10-15 NOTE — Telephone Encounter (Signed)
"  Rhonda with [facility] (Ph: [phone] option 2 Fax: [phone]).  Request faxed to [phone] for history, physical and treatment plan was requested on 10-09-2016 and we have not yet received this information."   H.I.M. Request refax and report have not received.  Navigator sent request.

## 2016-10-18 ENCOUNTER — Telehealth: Payer: Self-pay | Admitting: *Deleted

## 2016-10-18 NOTE — Telephone Encounter (Signed)
Received oncotype results of 18.  No chemo required.  Spoke to patient and informed her of these results.  Referral placed for Dr. Sondra Come.

## 2016-10-19 ENCOUNTER — Encounter: Payer: Self-pay | Admitting: Radiation Oncology

## 2016-10-19 ENCOUNTER — Telehealth: Payer: Self-pay | Admitting: Hematology and Oncology

## 2016-10-19 ENCOUNTER — Encounter (HOSPITAL_COMMUNITY): Payer: Self-pay

## 2016-10-19 NOTE — Telephone Encounter (Signed)
FAXED RECORDS TO [facility] (IR:[id number] RELEASE ID)

## 2016-10-26 NOTE — Progress Notes (Signed)
Location of Breast Cancer: Malignant neoplasm of upper-inner quadrant of left breast  Histology per Pathology Report:    Receptor Status: ER(95% +), PR (90% +), Her2-neu (-), Ki-67 (5% and 15%)  Did patient present with symptoms (if so, please note symptoms) or was this found on screening mammography?: screening mammogram  Past/Anticipated interventions by surgeon, if any: Left breast cancer treated with lumpectomy on 09/27/2016. She has a positive posterior margin which happens to be muscle   Past/Anticipated interventions by medical oncology, if any: Adjuvant antiestrogen therapy  Lymphedema issues, if any:  Has firmness in her left arm  Pain issues, if any:  yes reports having increased pain in her left arm and breast that started a few days ago.  She reports having a red streak around her breast and that she is having nuimbness in her left fingers.  SAFETY ISSUES:  Prior radiation? no  Pacemaker/ICD? no  Possible current pregnancy?no  Is the patient on methotrexate? no  Current Complaints / other details: Patient's mother died 3 months ago with metastatic uterine cancer. She had gone to chemotherapy. This is on top of her mind and she is extremely anxious and worried about cancer recurrence and dying from it.  Patient is here with her husband, Randall Hiss and son, Jiles Harold.  BP (!) 166/92 (BP Location: Right Arm, Patient Position: Sitting)   Pulse 69   Temp 98.3 F (36.8 C) (Oral)   Ht 5' 4"  (1.626 m)   Wt 174 lb 3.2 oz (79 kg)   SpO2 98%   BMI 29.90 kg/m    Wt Readings from Last 3 Encounters:  11/02/16 174 lb 3.2 oz (79 kg)  10/04/16 173 lb 4.8 oz (78.6 kg)  09/27/16 168 lb 8 oz (76.4 kg)       Slabaugh, Donna Christen, RN 10/26/2016,12:32 PM

## 2016-10-31 ENCOUNTER — Telehealth: Payer: Self-pay | Admitting: Oncology

## 2016-10-31 NOTE — Telephone Encounter (Signed)
Called Bonnie Marquez and explained that her appointment on Friday is to talk about radiation treatment and to plan when her CT Simulation will be.  Bonnie Marquez said she recently started a new job and her boss needs to plan around her absences for treatment.  Advised her that she will know on Friday when her CT SIM will be and an estimate of when treatment will start.  Bernetha verbalized understanding and agreement.

## 2016-11-02 ENCOUNTER — Encounter: Payer: Self-pay | Admitting: Radiation Oncology

## 2016-11-02 ENCOUNTER — Ambulatory Visit
Admission: RE | Admit: 2016-11-02 | Discharge: 2016-11-02 | Disposition: A | Discharge status: Home / Self Care | Payer: No Typology Code available for payment source | Source: Ambulatory Visit | Attending: Radiation Oncology | Admitting: Radiation Oncology

## 2016-11-02 ENCOUNTER — Encounter: Payer: Self-pay | Admitting: Hematology and Oncology

## 2016-11-02 VITALS — BP 166/92 | HR 69 | Temp 98.3°F | Ht 64.0 in | Wt 174.2 lb

## 2016-11-02 DIAGNOSIS — R200 Anesthesia of skin: Secondary | ICD-10-CM | POA: Insufficient documentation

## 2016-11-02 DIAGNOSIS — C50212 Malignant neoplasm of upper-inner quadrant of left female breast: Secondary | ICD-10-CM | POA: Diagnosis present

## 2016-11-02 DIAGNOSIS — Z79899 Other long term (current) drug therapy: Secondary | ICD-10-CM | POA: Diagnosis not present

## 2016-11-02 DIAGNOSIS — Z9889 Other specified postprocedural states: Secondary | ICD-10-CM | POA: Diagnosis not present

## 2016-11-02 DIAGNOSIS — Z170 Estrogen receptor positive status [ER+]: Secondary | ICD-10-CM | POA: Insufficient documentation

## 2016-11-02 DIAGNOSIS — Z510 Encounter for antineoplastic radiation therapy: Secondary | ICD-10-CM | POA: Insufficient documentation

## 2016-11-02 DIAGNOSIS — Z885 Allergy status to narcotic agent status: Secondary | ICD-10-CM | POA: Insufficient documentation

## 2016-11-02 MED ORDER — DOXYCYCLINE HYCLATE 100 MG PO TABS: 100.0000 mg | ORAL_TABLET | Freq: Two times a day (BID) | ORAL | 0 refills | Status: DC

## 2016-11-02 NOTE — Progress Notes (Signed)
Please see the Nurse Progress Note in the MD Initial Consult Encounter for this patient. 

## 2016-11-02 NOTE — Progress Notes (Signed)
Radiation Oncology         [phone] ________________________________  Name: TRACE CEDERBERG MRN: [id number]  Date: 11/02/2016  DOB: 16-Dec-1960  Re-evaluation Note  CC: Paulo Fruit, MD  Nicholas Lose, MD    ICD-9-CM ICD-10-CM   1. Malignant neoplasm of upper-inner quadrant of left breast in female, estrogen receptor positive ([place]) 174.2 C50.212    V86.0 Z17.0     Diagnosis: Malignant neoplasm of upper-inner quadrant of left breast in female, estrogen receptor positive ([place]).  Narrative:  The patient returns today for re-evaluation.  Since breast clinic, the patient underwent left breast lumpectomy on 09/17/16. She had a focally positive posterior margin which happens to be at pectoralis  muscle.                           Patient notes firmness in her left arm. She also notes increased pain in her left arm and breast that started a few days ago for which she is taking Tylenol. She reports having a red streak around her breast and numbness in her left fingers.  Of note, the patient's mother died 3 months ago from metastatic uterine cancer. Patient is concerned about dying from cancer recurrence.    Malignant neoplasm of upper-inner quadrant of left breast in female, estrogen receptor positive ([place])   09/13/2016 Initial Diagnosis    Left breast biopsy UIQ: Grade 1 invasive ductal carcinoma ER 95%, PR 90%, Ki-67 15%, HER-2 negative ratio 1.41; screening detected left breast mass at 11:30 position 6 mm size, axilla negative, T1 BN 0 stage IA clinical stage      09/27/2016 Surgery    Left Lumpectomy: IDC 2 tumors 1.2 cm and 1 cm, 0/3 LN Neg, Positive posterior margin (Muscle),  Er 95%, PR 90% Ki67: 15%, Her 2 Neg, T1bN0 Stage 1A        ALLERGIES:  is allergic to codeine.  Meds: Current Outpatient Prescriptions  Medication Sig Dispense Refill  . triamterene-hydrochlorothiazide (DYAZIDE) 37.5-25 MG capsule Take 1 capsule by mouth daily.    . Diethylpropion HCl CR 75 MG TB24  Take by mouth.    . doxycycline (VIBRA-TABS) 100 MG tablet Take 1 tablet (100 mg total) by mouth 2 (two) times daily. 20 tablet 0  . HYDROcodone-acetaminophen (NORCO/VICODIN) 5-325 MG tablet Take 1-2 tablets by mouth every 4 (four) hours as needed for moderate pain or severe pain. (Patient not taking: Reported on 11/02/2016) 20 tablet 0  . traMADol (ULTRAM) 50 MG tablet Take 1-2 tablets (50-100 mg total) by mouth every 6 (six) hours as needed. (Patient not taking: Reported on 11/02/2016) 20 tablet 0   No current facility-administered medications for this encounter.     Physical Findings: The patient is in no acute distress. Patient is alert and oriented.  height is 5' 4"  (1.626 m) and weight is 174 lb 3.2 oz (79 kg). Her oral temperature is 98.3 F (36.8 C). Her blood pressure is 166/92 (abnormal) and her pulse is 69. Her oxygen saturation is 98%. .  No significant changes. Lungs are clear to auscultation bilaterally. Heart has regular rate and rhythm. No palpable cervical, supraclavicular, or axillary adenopathy. Abdomen soft, non-tender, normal bowel sounds. Left breast shows some erythema and somewhat darker compared to the right breast. Patient has a well healed scar on the upper aspect of the breast. She has a separate scar in the axillary region which shows erythema but no breakdown or drainage. No palpable mass or  nipple discharge.   Lab Findings: Lab Results  Component Value Date   WBC 5.5 09/19/2016   HGB 14.4 09/19/2016   HCT 42.7 09/19/2016   MCV 94.5 09/19/2016   PLT 254 09/19/2016    Radiographic Findings: No results found.  Impression: Stage I invasive ductal carcinoma of the left breast (mpT1c, pN0). The patient had two separate small lesions in close proximity to one another. One of these lesions did focally involve the posterior margin (chest wall). Patient would be a good candidate for breast conservation therapy. In light of the focally involved posterior margin, patient  will receive a boost to the lumpectomy cavity after receiving external radiation. Given the patient's breast size, I doubt we will be able to perform hypofractioned accelerated radiation therapy. The patient developed discomfort in the left breast and upper arm area which she did not have immediately after surgery. Given the clinical findings, she likely has mastitis/ cellulitis. She will be placed on doxycycline for a 10 day course. I discussed the course of treatment, side effects, and potential toxicities with the patient, her husband, and her son. She appears to understand and wishes to proceed with treatment. A consent form was signed and a copy was placed in the patient's chart.  Plan:  Simulation and treatment planning will be scheduled for 11/05/16 at 3pm. Anticipate treatment to begin one week after simulation and planning. Patient will be placed on a 10 day course of antibiotics for mastitis/cellulitis.   ____________________________________    This document serves as a record of services personally performed by Gery Pray, MD. It was created on his behalf by Bethann Humble, a trained medical scribe. The creation of this record is based on the scribe's personal observations and the provider's statements to them. This document has been checked and approved by the attending provider.

## 2016-11-05 ENCOUNTER — Ambulatory Visit
Admission: RE | Admit: 2016-11-05 | Discharge: 2016-11-05 | Disposition: A | Discharge status: Home / Self Care | Payer: No Typology Code available for payment source | Source: Ambulatory Visit | Attending: Radiation Oncology | Admitting: Radiation Oncology

## 2016-11-05 DIAGNOSIS — C50212 Malignant neoplasm of upper-inner quadrant of left female breast: Secondary | ICD-10-CM

## 2016-11-05 DIAGNOSIS — Z170 Estrogen receptor positive status [ER+]: Principal | ICD-10-CM

## 2016-11-05 DIAGNOSIS — Z510 Encounter for antineoplastic radiation therapy: Secondary | ICD-10-CM | POA: Diagnosis not present

## 2016-11-05 NOTE — Progress Notes (Signed)
  Radiation Oncology         (336) [phone] ________________________________  Name: Bonnie Marquez MRN: [id number]  Date: 11/05/2016  DOB: 09/13/1961  SIMULATION AND TREATMENT PLANNING NOTE    ICD-9-CM ICD-10-CM   1. Malignant neoplasm of upper-inner quadrant of left breast in female, estrogen receptor positive ([place]) 174.2 C50.212    V86.0 Z17.0     DIAGNOSIS: Malignant neoplasm of upper-inner quadrant of left breast in female, estrogen receptor positive ([place]).  NARRATIVE:  The patient was brought to the [facility].  Identity was confirmed.  All relevant records and images related to the planned course of therapy were reviewed.  The patient freely provided informed written consent to proceed with treatment after reviewing the details related to the planned course of therapy. The consent form was witnessed and verified by the simulation staff.  Then, the patient was set-up in a stable reproducible  supine position for radiation therapy.  CT images were obtained.  Surface markings were placed.  The CT images were loaded into the planning software.  Then the target and avoidance structures were contoured.  Treatment planning then occurred.  The radiation prescription was entered and confirmed.  Then, I designed and supervised the construction of a total of 3 medically necessary complex treatment devices.  I have requested : 3D Simulation  I have requested a DVH of the following structures: heart, lungs, lumpectomy cavity.  I have ordered: dose calc.  PLAN:  The patient will receive 50.4 Gy in 28 fractions directed to the left breast followed by boost to the lumpectomy cavity of 12.6 Gy with a cumulative dose of 63 Gy.  -----------------------------------  Blair Promise, PhD, MD  This document serves as a record of services personally performed by Gery Pray, MD. It was created on his behalf by Bethann Humble, a trained medical scribe. The creation of this record is based on the  scribe's personal observations and the provider's statements to them. This document has been checked and approved by the attending provider.

## 2016-11-08 DIAGNOSIS — Z510 Encounter for antineoplastic radiation therapy: Secondary | ICD-10-CM | POA: Diagnosis not present

## 2016-11-17 ENCOUNTER — Telehealth: Payer: Self-pay | Admitting: Hematology and Oncology

## 2016-11-17 NOTE — Telephone Encounter (Signed)
Left message re 3/12 f/u. Schedule mailed.

## 2016-11-19 ENCOUNTER — Ambulatory Visit
Admission: RE | Admit: 2016-11-19 | Discharge: 2016-11-19 | Disposition: A | Discharge status: Home / Self Care | Payer: No Typology Code available for payment source | Source: Ambulatory Visit | Attending: Radiation Oncology | Admitting: Radiation Oncology

## 2016-11-19 DIAGNOSIS — C50212 Malignant neoplasm of upper-inner quadrant of left female breast: Secondary | ICD-10-CM

## 2016-11-19 DIAGNOSIS — Z170 Estrogen receptor positive status [ER+]: Principal | ICD-10-CM

## 2016-11-19 DIAGNOSIS — Z510 Encounter for antineoplastic radiation therapy: Secondary | ICD-10-CM | POA: Diagnosis not present

## 2016-11-19 MED ORDER — RADIAPLEXRX EX GEL
Freq: Once | CUTANEOUS | Status: AC
  Administered 2016-11-19: 16:00:00 via TOPICAL

## 2016-11-19 MED ORDER — ALRA NON-METALLIC DEODORANT (RAD-ONC)
1.0000 "application " | Freq: Once | TOPICAL | Status: AC
  Administered 2016-11-19: 1 via TOPICAL

## 2016-11-19 NOTE — Progress Notes (Signed)
Pt here for patient teaching.  Pt given Radiation and You booklet, skin care instructions, Alra deodorant and Radiaplex gel.  Reviewed areas of pertinence such as fatigue, skin changes, breast tenderness and breast swelling . Pt able to give teach back of to pat skin and use unscented/gentle soap,apply Radiaplex bid, avoid applying anything to skin within 4 hours of treatment and to use an electric razor if they must shave. Pt demonstrated understanding and verbalizes understanding of information given and will contact nursing with any questions or concerns.          

## 2016-11-20 ENCOUNTER — Ambulatory Visit
Admission: RE | Admit: 2016-11-20 | Discharge: 2016-11-20 | Disposition: A | Discharge status: Home / Self Care | Payer: No Typology Code available for payment source | Source: Ambulatory Visit | Attending: Radiation Oncology | Admitting: Radiation Oncology

## 2016-11-20 ENCOUNTER — Encounter: Payer: Self-pay | Admitting: Radiation Oncology

## 2016-11-20 VITALS — BP 152/91 | HR 66 | Temp 98.1°F

## 2016-11-20 DIAGNOSIS — Z170 Estrogen receptor positive status [ER+]: Principal | ICD-10-CM

## 2016-11-20 DIAGNOSIS — C50212 Malignant neoplasm of upper-inner quadrant of left female breast: Secondary | ICD-10-CM

## 2016-11-20 DIAGNOSIS — Z510 Encounter for antineoplastic radiation therapy: Secondary | ICD-10-CM | POA: Diagnosis not present

## 2016-11-20 NOTE — Progress Notes (Signed)
  Radiation Oncology         [phone] ________________________________  Name: Bonnie Marquez MRN: [id number]  Date: 11/20/2016  DOB: 05-08-1961  Weekly Radiation Therapy Management    ICD-9-CM ICD-10-CM   1. Malignant neoplasm of upper-inner quadrant of left breast in female, estrogen receptor positive ([facility]) 174.2 C50.212    V86.0 Z17.0      Current Dose: 1.8 Gy     Planned Dose:  63 Gy  Narrative . . . . . . . . The patient presents for routine under treatment assessment.                                    Bonnie Marquez has completed 1 fraction to her left breast. She reports having pain in her left arm and is taking a medication from her surgeon.  She does not remember the name of it. She denies having fatigue. The skin on her left breast is intact. She is using radiaplex as directed.                                  Set-up films were reviewed.                                 The chart was checked. Physical Findings. . .  oral temperature is 98.1 F (36.7 C). Her blood pressure is 152/91 (abnormal) and her pulse is 66. Her oxygen saturation is 100%. . Weight essentially stable. Lungs are clear to auscultation bilaterally. Heart has regular rate and rhythm. Some mild erythema to the breast, overall much better since being placed on antibiotics. Impression . . . . . . . The patient is tolerating radiation. Plan . . . . . . . . . . . . Continue treatment as planned.  ________________________________   Blair Promise, PhD, MD

## 2016-11-20 NOTE — Progress Notes (Signed)
Bonnie Marquez has completed 1 fraction to her left breast.  She reports having pain in her left arm and is taking a medciation from her Psychologist, sport and exercise.  She does not remember the name of it.  She denies having fatigue.  The skin on her left breast is intact.  She is using radiaplex.  BP (!) 152/91 (BP Location: Right Arm, Patient Position: Sitting)   Pulse 66   Temp 98.1 F (36.7 C) (Oral)   SpO2 100%

## 2016-11-21 ENCOUNTER — Ambulatory Visit
Admission: RE | Admit: 2016-11-21 | Discharge: 2016-11-21 | Disposition: A | Discharge status: Home / Self Care | Payer: No Typology Code available for payment source | Source: Ambulatory Visit | Attending: Radiation Oncology | Admitting: Radiation Oncology

## 2016-11-21 DIAGNOSIS — Z510 Encounter for antineoplastic radiation therapy: Secondary | ICD-10-CM | POA: Diagnosis not present

## 2016-11-22 ENCOUNTER — Ambulatory Visit
Admission: RE | Admit: 2016-11-22 | Discharge: 2016-11-22 | Disposition: A | Discharge status: Home / Self Care | Payer: No Typology Code available for payment source | Source: Ambulatory Visit | Attending: Radiation Oncology | Admitting: Radiation Oncology

## 2016-11-22 DIAGNOSIS — Z510 Encounter for antineoplastic radiation therapy: Secondary | ICD-10-CM | POA: Diagnosis not present

## 2016-11-23 ENCOUNTER — Ambulatory Visit
Admission: RE | Admit: 2016-11-23 | Discharge: 2016-11-23 | Disposition: A | Discharge status: Home / Self Care | Payer: No Typology Code available for payment source | Source: Ambulatory Visit | Attending: Radiation Oncology | Admitting: Radiation Oncology

## 2016-11-23 DIAGNOSIS — Z510 Encounter for antineoplastic radiation therapy: Secondary | ICD-10-CM | POA: Diagnosis not present

## 2016-11-26 ENCOUNTER — Ambulatory Visit
Admission: RE | Admit: 2016-11-26 | Discharge: 2016-11-26 | Disposition: A | Discharge status: Home / Self Care | Payer: No Typology Code available for payment source | Source: Ambulatory Visit | Attending: Radiation Oncology | Admitting: Radiation Oncology

## 2016-11-26 DIAGNOSIS — Z510 Encounter for antineoplastic radiation therapy: Secondary | ICD-10-CM | POA: Diagnosis not present

## 2016-11-27 ENCOUNTER — Ambulatory Visit
Admission: RE | Admit: 2016-11-27 | Discharge: 2016-11-27 | Disposition: A | Discharge status: Home / Self Care | Payer: No Typology Code available for payment source | Source: Ambulatory Visit | Attending: Radiation Oncology | Admitting: Radiation Oncology

## 2016-11-27 ENCOUNTER — Encounter: Payer: Self-pay | Admitting: Radiation Oncology

## 2016-11-27 VITALS — BP 150/100 | HR 75 | Temp 98.3°F

## 2016-11-27 DIAGNOSIS — Z923 Personal history of irradiation: Secondary | ICD-10-CM | POA: Insufficient documentation

## 2016-11-27 DIAGNOSIS — Z170 Estrogen receptor positive status [ER+]: Secondary | ICD-10-CM | POA: Diagnosis present

## 2016-11-27 DIAGNOSIS — C50212 Malignant neoplasm of upper-inner quadrant of left female breast: Secondary | ICD-10-CM | POA: Diagnosis present

## 2016-11-27 DIAGNOSIS — Z510 Encounter for antineoplastic radiation therapy: Secondary | ICD-10-CM | POA: Diagnosis not present

## 2016-11-27 MED ORDER — BIAFINE EX EMUL
Freq: Once | CUTANEOUS | Status: AC
  Administered 2016-11-27: 17:00:00 via TOPICAL

## 2016-11-27 NOTE — Progress Notes (Signed)
  Radiation Oncology         [phone] ________________________________  Name: Bonnie Marquez MRN: [id number]  Date: 11/27/2016  DOB: Jul 11, 1961  Weekly Radiation Therapy Management    ICD-9-CM ICD-10-CM   1. Malignant neoplasm of upper-inner quadrant of left breast in female, estrogen receptor positive ([facility]) 174.2 C50.212 topical emolient (BIAFINE) emulsion   V86.0 Z17.0      Current Dose: 10.8 Gy     Planned Dose:  63 Gy  Narrative . . . . . . . . The patient presents for routine under treatment assessment.                                    Bonnie Marquez has completed 6 fractions to her left breast. She reports having some aching in her left breast. She also reports a warmth to her breast in the evenings. She reports fatigue. The patient is using Radiaplex bid as directed. Nursing notes the skin on the left breast is red.                                  Set-up films were reviewed.                                 The chart was checked. Physical Findings. . .  oral temperature is 98.3 F (36.8 C). Her blood pressure is 150/100 (abnormal) and her pulse is 75. Her oxygen saturation is 99%.  Weight essentially stable. Lungs are clear to auscultation bilaterally. Heart has regular rate and rhythm. Some erythema in the treatment area, possibly an area where she is placing the Radiaplex. Impression . . . . . . . The patient is tolerating radiation. The patient may be having a mild reaction to the Radiaplex and will be switched to Biafine. Plan . . . . . . . . . . . . Continue treatment as planned. Nursing provided hydrogel pads and Biafine.  ________________________________   Blair Promise, PhD, MD  This document serves as a record of services personally performed by Gery Pray, MD. It was created on his behalf by Maryla Morrow, a trained medical scribe. The creation of this record is based on the scribe's personal observations and the provider's statements to them. This  document has been checked and approved by the attending provider.

## 2016-11-27 NOTE — Progress Notes (Signed)
Bonnie Marquez has completed 6 fractions to her left breast.  She reports having some aching in her left breast.  She also reports if feels warm to the touch in the evenings.  She reports having fatigue.  She is using radiaplex and has been provided with a refill.  She has also been given hydrogel pads to try.  The skin on her left breast is red/pink.  BP (!) 150/100 (BP Location: Right Arm, Patient Position: Sitting)   Pulse 75   Temp 98.3 F (36.8 C) (Oral)   SpO2 99%    Wt Readings from Last 3 Encounters:  11/02/16 174 lb 3.2 oz (79 kg)  10/04/16 173 lb 4.8 oz (78.6 kg)  09/27/16 168 lb 8 oz (76.4 kg)

## 2016-11-28 ENCOUNTER — Ambulatory Visit
Admission: RE | Admit: 2016-11-28 | Discharge: 2016-11-28 | Disposition: A | Discharge status: Home / Self Care | Payer: No Typology Code available for payment source | Source: Ambulatory Visit | Attending: Radiation Oncology | Admitting: Radiation Oncology

## 2016-11-28 DIAGNOSIS — Z510 Encounter for antineoplastic radiation therapy: Secondary | ICD-10-CM | POA: Diagnosis not present

## 2016-11-29 ENCOUNTER — Ambulatory Visit
Admission: RE | Admit: 2016-11-29 | Discharge: 2016-11-29 | Disposition: A | Discharge status: Home / Self Care | Payer: No Typology Code available for payment source | Source: Ambulatory Visit | Attending: Radiation Oncology | Admitting: Radiation Oncology

## 2016-11-29 DIAGNOSIS — Z510 Encounter for antineoplastic radiation therapy: Secondary | ICD-10-CM | POA: Diagnosis not present

## 2016-11-30 ENCOUNTER — Ambulatory Visit
Admission: RE | Admit: 2016-11-30 | Discharge: 2016-11-30 | Disposition: A | Discharge status: Home / Self Care | Payer: No Typology Code available for payment source | Source: Ambulatory Visit | Attending: Radiation Oncology | Admitting: Radiation Oncology

## 2016-11-30 DIAGNOSIS — Z510 Encounter for antineoplastic radiation therapy: Secondary | ICD-10-CM | POA: Diagnosis not present

## 2016-12-03 ENCOUNTER — Ambulatory Visit
Admission: RE | Admit: 2016-12-03 | Discharge: 2016-12-03 | Disposition: A | Discharge status: Home / Self Care | Payer: No Typology Code available for payment source | Source: Ambulatory Visit | Attending: Radiation Oncology | Admitting: Radiation Oncology

## 2016-12-03 DIAGNOSIS — Z510 Encounter for antineoplastic radiation therapy: Secondary | ICD-10-CM | POA: Diagnosis not present

## 2016-12-04 ENCOUNTER — Ambulatory Visit
Admission: RE | Admit: 2016-12-04 | Discharge: 2016-12-04 | Disposition: A | Discharge status: Home / Self Care | Payer: No Typology Code available for payment source | Source: Ambulatory Visit | Attending: Radiation Oncology | Admitting: Radiation Oncology

## 2016-12-04 DIAGNOSIS — Z510 Encounter for antineoplastic radiation therapy: Secondary | ICD-10-CM | POA: Diagnosis not present

## 2016-12-05 ENCOUNTER — Ambulatory Visit
Admission: RE | Admit: 2016-12-05 | Discharge: 2016-12-05 | Disposition: A | Discharge status: Home / Self Care | Payer: No Typology Code available for payment source | Source: Ambulatory Visit | Attending: Radiation Oncology | Admitting: Radiation Oncology

## 2016-12-05 DIAGNOSIS — Z510 Encounter for antineoplastic radiation therapy: Secondary | ICD-10-CM | POA: Diagnosis not present

## 2016-12-06 ENCOUNTER — Ambulatory Visit
Admission: RE | Admit: 2016-12-06 | Discharge: 2016-12-06 | Disposition: A | Discharge status: Home / Self Care | Payer: No Typology Code available for payment source | Source: Ambulatory Visit | Attending: Radiation Oncology | Admitting: Radiation Oncology

## 2016-12-06 ENCOUNTER — Encounter: Payer: Self-pay | Admitting: Radiation Oncology

## 2016-12-06 ENCOUNTER — Inpatient Hospital Stay
Admission: RE | Admit: 2016-12-06 | Discharge: 2016-12-06 | Disposition: A | Discharge status: Home / Self Care | Payer: Self-pay | Source: Ambulatory Visit | Attending: Radiation Oncology | Admitting: Radiation Oncology

## 2016-12-06 VITALS — BP 146/98 | HR 72 | Temp 98.4°F | Ht 64.0 in

## 2016-12-06 DIAGNOSIS — C50212 Malignant neoplasm of upper-inner quadrant of left female breast: Secondary | ICD-10-CM

## 2016-12-06 DIAGNOSIS — Z510 Encounter for antineoplastic radiation therapy: Secondary | ICD-10-CM | POA: Diagnosis not present

## 2016-12-06 DIAGNOSIS — Z170 Estrogen receptor positive status [ER+]: Principal | ICD-10-CM

## 2016-12-06 NOTE — Progress Notes (Signed)
Bonnie Marquez has completed 13 fractions to her right breast.  She denies having pain.  She reports having fatigue.  She is using biafine and hydrogel pads and reports having itching of her left breast.  She has been provided with a refill of hydrogel pads.  She is wondering how long she will need to wait for breast reduction surgery after radiation.  The skin on her left breast is red.  BP (!) 146/98 (BP Location: Right Arm, Patient Position: Sitting)   Pulse 72   Temp 98.4 F (36.9 C) (Oral)   Ht 5\' 4"  (1.626 m)

## 2016-12-06 NOTE — Progress Notes (Signed)
[place] [facility]

## 2016-12-06 NOTE — Progress Notes (Signed)
  Radiation Oncology         [phone] ________________________________  Name: Bonnie Marquez MRN: [id number]  Date: 12/06/2016  DOB: 1961-05-06  Weekly Radiation Therapy Management    ICD-9-CM ICD-10-CM   1. Malignant neoplasm of upper-inner quadrant of left breast in female, estrogen receptor positive ([place]) 174.2 C50.212    V86.0 Z17.0      Current Dose: 23.4 Gy     Planned Dose:  63 Gy  Narrative . . . . . . . . The patient presents for routine under treatment assessment.                                    Bonnie Marquez has completed 13 fractions to her left breast. She denies having pain. She reports fatigue. She reports she is using Biafine and hydrogel pads, and reports continued itching to the left breast. She questions how long she will need to wait for breast reduction surgery after radiation therapy, and if breast reduction would decrease her chance of cancer recurrence.                                  Set-up films were reviewed.                                 The chart was checked. Physical Findings. . .  height is 5\' 4"  (1.626 m). Her oral temperature is 98.4 F (36.9 C). Her blood pressure is 146/98 (abnormal) and her pulse is 72.  Weight essentially stable. Lungs are clear to auscultation bilaterally. Heart has regular rate and rhythm. Left breast is erythematous throughout with no skin breakdown. Impression . . . . . . . The patient is tolerating radiation.  Plan . . . . . . . . . . . . Continue treatment as planned. I advised the patient that she should wait several months following radiation before breast reduction surgery.  Nursing provided hydrogel pads.  ________________________________   Blair Promise, PhD, MD  This document serves as a record of services personally performed by Gery Pray, MD. It was created on his behalf by Maryla Morrow, a trained medical scribe. The creation of this record is based on the scribe's personal observations and the  provider's statements to them. This document has been checked and approved by the attending provider.

## 2016-12-07 ENCOUNTER — Ambulatory Visit
Admission: RE | Admit: 2016-12-07 | Discharge: 2016-12-07 | Disposition: A | Discharge status: Home / Self Care | Payer: No Typology Code available for payment source | Source: Ambulatory Visit | Attending: Radiation Oncology | Admitting: Radiation Oncology

## 2016-12-07 DIAGNOSIS — Z510 Encounter for antineoplastic radiation therapy: Secondary | ICD-10-CM | POA: Diagnosis not present

## 2016-12-10 ENCOUNTER — Ambulatory Visit: Payer: No Typology Code available for payment source | Admitting: Hematology and Oncology

## 2016-12-10 ENCOUNTER — Ambulatory Visit
Admission: RE | Admit: 2016-12-10 | Discharge: 2016-12-10 | Disposition: A | Discharge status: Home / Self Care | Payer: No Typology Code available for payment source | Source: Ambulatory Visit | Attending: Radiation Oncology | Admitting: Radiation Oncology

## 2016-12-10 DIAGNOSIS — Z510 Encounter for antineoplastic radiation therapy: Secondary | ICD-10-CM | POA: Diagnosis not present

## 2016-12-11 ENCOUNTER — Ambulatory Visit
Admission: RE | Admit: 2016-12-11 | Discharge: 2016-12-11 | Disposition: A | Discharge status: Home / Self Care | Payer: No Typology Code available for payment source | Source: Ambulatory Visit | Attending: Radiation Oncology | Admitting: Radiation Oncology

## 2016-12-11 ENCOUNTER — Encounter: Payer: Self-pay | Admitting: Radiation Oncology

## 2016-12-11 VITALS — BP 150/90 | HR 74 | Temp 98.2°F

## 2016-12-11 DIAGNOSIS — Z923 Personal history of irradiation: Secondary | ICD-10-CM | POA: Diagnosis not present

## 2016-12-11 DIAGNOSIS — Z170 Estrogen receptor positive status [ER+]: Secondary | ICD-10-CM | POA: Diagnosis not present

## 2016-12-11 DIAGNOSIS — L599 Disorder of the skin and subcutaneous tissue related to radiation, unspecified: Secondary | ICD-10-CM | POA: Diagnosis not present

## 2016-12-11 DIAGNOSIS — C50212 Malignant neoplasm of upper-inner quadrant of left female breast: Secondary | ICD-10-CM | POA: Diagnosis present

## 2016-12-11 DIAGNOSIS — Z510 Encounter for antineoplastic radiation therapy: Secondary | ICD-10-CM | POA: Diagnosis not present

## 2016-12-11 DIAGNOSIS — L298 Other pruritus: Secondary | ICD-10-CM | POA: Diagnosis not present

## 2016-12-11 MED ORDER — BIAFINE EX EMUL
Freq: Every day | CUTANEOUS | Status: DC
  Administered 2016-12-11: 16:00:00 via TOPICAL

## 2016-12-11 NOTE — Progress Notes (Signed)
  Radiation Oncology         [phone] ________________________________  Name: Bonnie Marquez MRN: [id number]  Date: 12/11/2016  DOB: 06/18/1961  Weekly Radiation Therapy Management    ICD-9-CM ICD-10-CM   1. Malignant neoplasm of upper-inner quadrant of left breast in female, estrogen receptor positive (HCC) 174.2 C50.212 topical emolient (BIAFINE) emulsion   V86.0 Z17.0      Current Dose: 28.8 Gy     Planned Dose:  63 Gy  Narrative . . . . . . . . The patient presents for routine under treatment assessment.                                    The patient completed 16/35 treatments to the left breast. The patient complains of burning and pruritus in the treatment field. The patient was given another tube of biafine cream per her request. She has a good appetite. The nurse notes bright erythema and intact skin in the left breast. The patient drinks 1 glass of red wine per night to help her sleep.                                  Set-up films were reviewed.                                 The chart was checked. Physical Findings. . .  oral temperature is 98.2 F (36.8 C). Her blood pressure is 150/90 (abnormal) and her pulse is 74.  Weight essentially stable. Lungs are clear to auscultation bilaterally. Heart has regular rate and rhythm. Erythema throughout the left breast with no skin breakdown. Impression . . . . . . . The patient is tolerating radiation.  Plan . . . . . . . . . . . . Continue treatment as planned. The patient will use biafine in the treatment area. She can use hydrocortisone cream and oral benadryl for her pruritus. ________________________________   Blair Promise, PhD, MD  This document serves as a record of services personally performed by Gery Pray, MD. It was created on his behalf by Darcus Austin, a trained medical scribe. The creation of this record is based on the scribe's personal observations and the provider's statements to them. This document has been  checked and approved by the attending provider.

## 2016-12-11 NOTE — Progress Notes (Signed)
Weekly rad tx left breast 16/35 completed, bright erythema, skin intact, c/o burning and itching, gave another biafine cream per request, appetite good 3:50 PM BP (!) 150/90 (BP Location: Right Arm, Patient Position: Sitting, Cuff Size: Normal)   Pulse 74   Temp 98.2 F (36.8 C) (Oral)   .k

## 2016-12-12 ENCOUNTER — Ambulatory Visit
Admission: RE | Admit: 2016-12-12 | Discharge: 2016-12-12 | Disposition: A | Discharge status: Home / Self Care | Payer: No Typology Code available for payment source | Source: Ambulatory Visit | Attending: Radiation Oncology | Admitting: Radiation Oncology

## 2016-12-12 DIAGNOSIS — Z510 Encounter for antineoplastic radiation therapy: Secondary | ICD-10-CM | POA: Diagnosis not present

## 2016-12-13 ENCOUNTER — Ambulatory Visit
Admission: RE | Admit: 2016-12-13 | Discharge: 2016-12-13 | Disposition: A | Discharge status: Home / Self Care | Payer: No Typology Code available for payment source | Source: Ambulatory Visit | Attending: Radiation Oncology | Admitting: Radiation Oncology

## 2016-12-13 DIAGNOSIS — Z510 Encounter for antineoplastic radiation therapy: Secondary | ICD-10-CM | POA: Diagnosis not present

## 2016-12-14 ENCOUNTER — Ambulatory Visit
Admission: RE | Admit: 2016-12-14 | Discharge: 2016-12-14 | Disposition: A | Discharge status: Home / Self Care | Payer: No Typology Code available for payment source | Source: Ambulatory Visit | Attending: Radiation Oncology | Admitting: Radiation Oncology

## 2016-12-14 DIAGNOSIS — Z510 Encounter for antineoplastic radiation therapy: Secondary | ICD-10-CM | POA: Diagnosis not present

## 2016-12-17 ENCOUNTER — Ambulatory Visit
Admission: RE | Admit: 2016-12-17 | Discharge: 2016-12-17 | Disposition: A | Discharge status: Home / Self Care | Payer: No Typology Code available for payment source | Source: Ambulatory Visit | Attending: Radiation Oncology | Admitting: Radiation Oncology

## 2016-12-17 DIAGNOSIS — Z510 Encounter for antineoplastic radiation therapy: Secondary | ICD-10-CM | POA: Diagnosis not present

## 2016-12-18 ENCOUNTER — Ambulatory Visit
Admission: RE | Admit: 2016-12-18 | Discharge: 2016-12-18 | Disposition: A | Discharge status: Home / Self Care | Payer: No Typology Code available for payment source | Source: Ambulatory Visit | Attending: Radiation Oncology | Admitting: Radiation Oncology

## 2016-12-18 VITALS — BP 132/93 | HR 81 | Temp 99.6°F | Resp 16

## 2016-12-18 DIAGNOSIS — L598 Other specified disorders of the skin and subcutaneous tissue related to radiation: Secondary | ICD-10-CM | POA: Insufficient documentation

## 2016-12-18 DIAGNOSIS — C50212 Malignant neoplasm of upper-inner quadrant of left female breast: Secondary | ICD-10-CM | POA: Diagnosis not present

## 2016-12-18 DIAGNOSIS — R030 Elevated blood-pressure reading, without diagnosis of hypertension: Secondary | ICD-10-CM | POA: Insufficient documentation

## 2016-12-18 DIAGNOSIS — R530 Neoplastic (malignant) related fatigue: Secondary | ICD-10-CM | POA: Insufficient documentation

## 2016-12-18 DIAGNOSIS — Z923 Personal history of irradiation: Secondary | ICD-10-CM | POA: Insufficient documentation

## 2016-12-18 DIAGNOSIS — Z170 Estrogen receptor positive status [ER+]: Secondary | ICD-10-CM | POA: Diagnosis present

## 2016-12-18 DIAGNOSIS — Z510 Encounter for antineoplastic radiation therapy: Secondary | ICD-10-CM | POA: Diagnosis not present

## 2016-12-18 NOTE — Progress Notes (Signed)
  Radiation Oncology         [phone] ________________________________  Name: Bonnie Marquez MRN: [id number]  Date: 12/18/2016  DOB: 02/15/1961  Weekly Radiation Therapy Management    ICD-9-CM ICD-10-CM   1. Malignant neoplasm of upper-inner quadrant of left breast in female, estrogen receptor positive (HCC) 174.2 C50.212    V86.0 Z17.0      Current Dose: 37.8 Gy     Planned Dose:  63 Gy  Narrative . . . . . . . . The patient presents for routine under treatment assessment.                                    Welford Roche completed 21 fractions to the left breast. The patient complains of pain to left breast and states she has a rash. The patient declined letting the RN assess skin until the MD is present due to pain. Reports having fatigue. She states her husband was diagnosed with the flu and is taking all measures to keep away from him. She is curious if she can take anything proactively to help prevent her from getting flu as well.  No decrease in appetite and refused to get on scale, but denies any obvious weight loss.                                  Set-up films were reviewed.                                 The chart was checked. Physical Findings. . .  oral temperature is 99.6 F (37.6 C). Her blood pressure is 132/93 (abnormal) and her pulse is 81. Her respiration is 16 and oxygen saturation is 100%.  Lungs are clear to auscultation bilaterally. Heart has regular rate and rhythm. 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 Impression . . . . . . . The patient is tolerating radiation.  Plan . . . . . . . . . . . . Continue treatment as planned.  ________________________________   Blair Promise, PhD, MD  This document serves as a record of services personally performed by Gery Pray, MD. It was created on his behalf by Darcus Austin, a trained medical scribe. The creation of this record is based on the scribe's personal observations  and the provider's statements to them. This document has been checked and approved by the attending provider.

## 2016-12-18 NOTE — Progress Notes (Signed)
Bonnie Marquez completed 21st fraction to left breast.  Patient complains of pain to left breast and states she has a rash.  Patient declines letting RN assess skin until MD present because of pain.  Reports having fatigue.  States husband has flu and is taking all measures to keep away from him.  Curious if she can take anything proactively to help prevent her from getting flu as well.  No decrease in appetite and refused to get on scale but denies any obvious weight loss.  Vitals:   12/18/16 1619  BP: (!) 132/93  Pulse: 81  Resp: 16  Temp: 99.6 F (37.6 C)  TempSrc: Oral  SpO2: 100%

## 2016-12-19 ENCOUNTER — Ambulatory Visit
Admission: RE | Admit: 2016-12-19 | Discharge: 2016-12-19 | Disposition: A | Discharge status: Home / Self Care | Payer: No Typology Code available for payment source | Source: Ambulatory Visit | Attending: Radiation Oncology | Admitting: Radiation Oncology

## 2016-12-19 DIAGNOSIS — Z510 Encounter for antineoplastic radiation therapy: Secondary | ICD-10-CM | POA: Diagnosis not present

## 2016-12-20 ENCOUNTER — Ambulatory Visit
Admission: RE | Admit: 2016-12-20 | Discharge: 2016-12-20 | Disposition: A | Discharge status: Home / Self Care | Payer: No Typology Code available for payment source | Source: Ambulatory Visit | Attending: Radiation Oncology | Admitting: Radiation Oncology

## 2016-12-20 DIAGNOSIS — Z510 Encounter for antineoplastic radiation therapy: Secondary | ICD-10-CM | POA: Diagnosis not present

## 2016-12-21 ENCOUNTER — Ambulatory Visit
Admission: RE | Admit: 2016-12-21 | Discharge: 2016-12-21 | Disposition: A | Discharge status: Home / Self Care | Payer: No Typology Code available for payment source | Source: Ambulatory Visit | Attending: Radiation Oncology | Admitting: Radiation Oncology

## 2016-12-21 DIAGNOSIS — Z510 Encounter for antineoplastic radiation therapy: Secondary | ICD-10-CM | POA: Diagnosis not present

## 2016-12-24 ENCOUNTER — Ambulatory Visit
Admission: RE | Admit: 2016-12-24 | Discharge: 2016-12-24 | Disposition: A | Discharge status: Home / Self Care | Payer: No Typology Code available for payment source | Source: Ambulatory Visit | Attending: Radiation Oncology | Admitting: Radiation Oncology

## 2016-12-24 DIAGNOSIS — Z510 Encounter for antineoplastic radiation therapy: Secondary | ICD-10-CM | POA: Diagnosis not present

## 2016-12-25 ENCOUNTER — Ambulatory Visit
Admission: RE | Admit: 2016-12-25 | Discharge: 2016-12-25 | Disposition: A | Discharge status: Home / Self Care | Payer: No Typology Code available for payment source | Source: Ambulatory Visit | Attending: Radiation Oncology | Admitting: Radiation Oncology

## 2016-12-25 ENCOUNTER — Encounter: Payer: Self-pay | Admitting: Radiation Oncology

## 2016-12-25 VITALS — BP 142/98 | HR 75 | Temp 99.3°F

## 2016-12-25 DIAGNOSIS — C50212 Malignant neoplasm of upper-inner quadrant of left female breast: Secondary | ICD-10-CM

## 2016-12-25 DIAGNOSIS — Z170 Estrogen receptor positive status [ER+]: Principal | ICD-10-CM

## 2016-12-25 DIAGNOSIS — Z510 Encounter for antineoplastic radiation therapy: Secondary | ICD-10-CM | POA: Diagnosis not present

## 2016-12-25 NOTE — Progress Notes (Signed)
  Radiation Oncology         [phone] ________________________________  Name: Bonnie Marquez MRN: [id number]  Date: 12/25/2016  DOB: 1961/09/17  Weekly Radiation Therapy Management    ICD-9-CM ICD-10-CM   1. Malignant neoplasm of upper-inner quadrant of left breast in female, estrogen receptor positive (HCC) 174.2 C50.212    V86.0 Z17.0      Current Dose: 46.8 Gy     Planned Dose:  63 Gy  Narrative . . . . . . . . The patient presents for routine under treatment assessment.                                    Dyan has completed 26 fractions to her left breast. She reports having a constant stabbing pain inside her right breast. She reports the pain is so bad that it keeps up up at night. She took tylenol and it doesn't help. She does not take advil because it upsets her stomach. She takes [place] before bed, but it doesn't help. She also reports having a burning feeling of the left breast. The lack of sleep is causing fatigue. She is using biafine and has been provided with a refill. She is also using hydrogel pads. The skin on her left breast is red.                                  Set-up films were reviewed.                                 The chart was checked. Physical Findings. . .  oral temperature is 99.3 F (37.4 C). Her blood pressure is 142/98 (abnormal) and her pulse is 75. Her oxygen saturation is 100%.  Lungs are clear to auscultation bilaterally. Heart has regular rate and rhythm. Brisk erythema throughout the left breast, no skin breakdown. Impression . . . . . . . The patient is tolerating radiation, but has constant pain of the left breast. Plan . . . . . . . . . . . . Continue treatment as planned. I offered pain medication, but the patient declined due to fear of not being able to wake up on time for work or becoming drowsy at work. Since she will start the boost soon, radiation will be directed only to the lumpectomy cavity and not the whole breast, which could ease  her symptoms. ________________________________   Blair Promise, PhD, MD  This document serves as a record of services personally performed by Gery Pray, MD. It was created on his behalf by Darcus Austin, a trained medical scribe. The creation of this record is based on the scribe's personal observations and the provider's statements to them. This document has been checked and approved by the attending provider.

## 2016-12-25 NOTE — Progress Notes (Signed)
Bonnie Marquez has completed 26 fractions to her left breast.  She reports having a constant stabbing pain inside her right breast.  She also reports having a burning feeling.  She reports having fatigue.  She is using biafine and has been provided with a refill.  She is also using hydrogel pads.  The skin on her left breast is red.  BP (!) 142/98 (BP Location: Right Arm, Patient Position: Sitting)   Pulse 75   Temp 99.3 F (37.4 C) (Oral)   SpO2 100%

## 2016-12-26 ENCOUNTER — Ambulatory Visit
Admission: RE | Admit: 2016-12-26 | Discharge: 2016-12-26 | Disposition: A | Discharge status: Home / Self Care | Payer: No Typology Code available for payment source | Source: Ambulatory Visit | Attending: Radiation Oncology | Admitting: Radiation Oncology

## 2016-12-26 ENCOUNTER — Telehealth: Payer: Self-pay | Admitting: Hematology and Oncology

## 2016-12-26 DIAGNOSIS — Z510 Encounter for antineoplastic radiation therapy: Secondary | ICD-10-CM | POA: Diagnosis not present

## 2016-12-26 NOTE — Telephone Encounter (Signed)
Received paperwork from [facility]) to be completed

## 2016-12-27 ENCOUNTER — Encounter: Payer: Self-pay | Admitting: Radiation Oncology

## 2016-12-27 ENCOUNTER — Ambulatory Visit
Admission: RE | Admit: 2016-12-27 | Discharge: 2016-12-27 | Disposition: A | Discharge status: Home / Self Care | Payer: No Typology Code available for payment source | Source: Ambulatory Visit | Attending: Radiation Oncology | Admitting: Radiation Oncology

## 2016-12-27 DIAGNOSIS — Z510 Encounter for antineoplastic radiation therapy: Secondary | ICD-10-CM | POA: Diagnosis not present

## 2016-12-27 NOTE — Progress Notes (Signed)
Paperwork ([employer]) received, given to nurse

## 2016-12-28 ENCOUNTER — Ambulatory Visit
Admission: RE | Admit: 2016-12-28 | Discharge: 2016-12-28 | Disposition: A | Discharge status: Home / Self Care | Payer: No Typology Code available for payment source | Source: Ambulatory Visit | Attending: Radiation Oncology | Admitting: Radiation Oncology

## 2016-12-28 ENCOUNTER — Telehealth: Payer: Self-pay | Admitting: Hematology and Oncology

## 2016-12-28 DIAGNOSIS — Z510 Encounter for antineoplastic radiation therapy: Secondary | ICD-10-CM | POA: Diagnosis not present

## 2016-12-28 NOTE — Telephone Encounter (Signed)
Faxed requested paperwork to [place] to attention Colin Rhein fax # [phone]

## 2016-12-31 ENCOUNTER — Ambulatory Visit
Admission: RE | Admit: 2016-12-31 | Discharge: 2016-12-31 | Disposition: A | Discharge status: Home / Self Care | Payer: No Typology Code available for payment source | Source: Ambulatory Visit | Attending: Radiation Oncology | Admitting: Radiation Oncology

## 2016-12-31 DIAGNOSIS — Z510 Encounter for antineoplastic radiation therapy: Secondary | ICD-10-CM | POA: Diagnosis not present

## 2017-01-01 ENCOUNTER — Ambulatory Visit
Admission: RE | Admit: 2017-01-01 | Discharge: 2017-01-01 | Disposition: A | Discharge status: Home / Self Care | Payer: No Typology Code available for payment source | Source: Ambulatory Visit | Attending: Radiation Oncology | Admitting: Radiation Oncology

## 2017-01-01 ENCOUNTER — Encounter: Payer: Self-pay | Admitting: Radiation Oncology

## 2017-01-01 VITALS — BP 161/102 | HR 70 | Temp 99.0°F

## 2017-01-01 DIAGNOSIS — Z510 Encounter for antineoplastic radiation therapy: Secondary | ICD-10-CM | POA: Diagnosis not present

## 2017-01-01 DIAGNOSIS — R05 Cough: Secondary | ICD-10-CM | POA: Insufficient documentation

## 2017-01-01 DIAGNOSIS — Z923 Personal history of irradiation: Secondary | ICD-10-CM | POA: Diagnosis not present

## 2017-01-01 DIAGNOSIS — G893 Neoplasm related pain (acute) (chronic): Secondary | ICD-10-CM | POA: Insufficient documentation

## 2017-01-01 DIAGNOSIS — C50212 Malignant neoplasm of upper-inner quadrant of left female breast: Secondary | ICD-10-CM

## 2017-01-01 DIAGNOSIS — Z170 Estrogen receptor positive status [ER+]: Secondary | ICD-10-CM | POA: Insufficient documentation

## 2017-01-01 DIAGNOSIS — R530 Neoplastic (malignant) related fatigue: Secondary | ICD-10-CM | POA: Insufficient documentation

## 2017-01-01 MED ORDER — BIAFINE EX EMUL
Freq: Once | CUTANEOUS | Status: AC
  Administered 2017-01-01: 18:00:00 via TOPICAL

## 2017-01-01 NOTE — Progress Notes (Signed)
  Radiation Oncology         [phone] ________________________________  Name: Bonnie Marquez MRN: [id number]  Date: 01/01/2017  DOB: 1961/06/27  Weekly Radiation Therapy Management    ICD-9-CM ICD-10-CM   1. Malignant neoplasm of upper-inner quadrant of left breast in female, estrogen receptor positive (HCC) 174.2 C50.212 topical emolient (BIAFINE) emulsion   V86.0 Z17.0      Current Dose: 56.4 Gy     Planned Dose:  63 Gy  Narrative . . . . . . . . The patient presents for routine under treatment assessment.                                    Bonnie Marquez has completed 31 fractions to her left breast.  She reports having shooting/stabbing pains in her left breast, a dry cough at night that started 2 weeks ago, and fatigue. She is using biafine and has been provided with a refill.                                  Set-up films were reviewed.                                 The chart was checked. Physical Findings. . .  oral temperature is 99 F (37.2 C). Her blood pressure is 161/102 (abnormal) and her pulse is 70. Her oxygen saturation is 100%.  Lungs are clear to auscultation bilaterally. Heart has regular rate and rhythm. The patient declined a breast exam today. Impression . . . . . . . The patient is tolerating radiation, but has constant pain of the left breast. Plan . . . . . . . . . . . . Continue treatment as planned. I previously offered pain medication, but the patient declined due to fear of not being able to wake up on time for work or becoming drowsy at work. ________________________________   Blair Promise, PhD, MD  This document serves as a record of services personally performed by Gery Pray, MD. It was created on his behalf by Darcus Austin, a trained medical scribe. The creation of this record is based on the scribe's personal observations and the provider's statements to them. This document has been checked and approved by the attending provider.

## 2017-01-01 NOTE — Progress Notes (Addendum)
Bonnie Marquez has completed 31 fractions to her left breast.  She reports having shooting/stabbing pains in her left breast.  She reports having a dry cough at night that started 2 weeks ago.  She reports feeling fatigued.  She is refusing having a breast exam today.  She is using biafine and has been provided with a refill.  BP (!) 161/102 (BP Location: Right Arm, Patient Position: Sitting)   Pulse 70   Temp 99 F (37.2 C) (Oral)   SpO2 100%

## 2017-01-02 ENCOUNTER — Ambulatory Visit
Admission: RE | Admit: 2017-01-02 | Discharge: 2017-01-02 | Disposition: A | Discharge status: Home / Self Care | Payer: No Typology Code available for payment source | Source: Ambulatory Visit | Attending: Radiation Oncology | Admitting: Radiation Oncology

## 2017-01-02 DIAGNOSIS — Z510 Encounter for antineoplastic radiation therapy: Secondary | ICD-10-CM | POA: Diagnosis not present

## 2017-01-03 ENCOUNTER — Ambulatory Visit
Admission: RE | Admit: 2017-01-03 | Discharge: 2017-01-03 | Disposition: A | Discharge status: Home / Self Care | Payer: No Typology Code available for payment source | Source: Ambulatory Visit | Attending: Radiation Oncology | Admitting: Radiation Oncology

## 2017-01-03 DIAGNOSIS — Z510 Encounter for antineoplastic radiation therapy: Secondary | ICD-10-CM | POA: Diagnosis not present

## 2017-01-04 ENCOUNTER — Ambulatory Visit
Admission: RE | Admit: 2017-01-04 | Discharge: 2017-01-04 | Disposition: A | Discharge status: Home / Self Care | Payer: No Typology Code available for payment source | Source: Ambulatory Visit | Attending: Radiation Oncology | Admitting: Radiation Oncology

## 2017-01-04 DIAGNOSIS — Z510 Encounter for antineoplastic radiation therapy: Secondary | ICD-10-CM | POA: Diagnosis not present

## 2017-01-07 ENCOUNTER — Ambulatory Visit
Admission: RE | Admit: 2017-01-07 | Discharge: 2017-01-07 | Disposition: A | Discharge status: Home / Self Care | Payer: No Typology Code available for payment source | Source: Ambulatory Visit | Attending: Radiation Oncology | Admitting: Radiation Oncology

## 2017-01-07 ENCOUNTER — Encounter: Payer: Self-pay | Admitting: Hematology and Oncology

## 2017-01-07 ENCOUNTER — Ambulatory Visit (HOSPITAL_BASED_OUTPATIENT_CLINIC_OR_DEPARTMENT_OTHER): Payer: No Typology Code available for payment source | Admitting: Hematology and Oncology

## 2017-01-07 DIAGNOSIS — Z170 Estrogen receptor positive status [ER+]: Secondary | ICD-10-CM

## 2017-01-07 DIAGNOSIS — Z510 Encounter for antineoplastic radiation therapy: Secondary | ICD-10-CM | POA: Diagnosis not present

## 2017-01-07 DIAGNOSIS — C50212 Malignant neoplasm of upper-inner quadrant of left female breast: Secondary | ICD-10-CM

## 2017-01-07 MED ORDER — ANASTROZOLE 1 MG PO TABS: 1.0000 mg | ORAL_TABLET | Freq: Every day | ORAL | 3 refills | Status: DC

## 2017-01-07 NOTE — Progress Notes (Signed)
Patient Care Team: Vania Rea, MD as PCP - General (Obstetrics and Gynecology)  DIAGNOSIS:  Encounter Diagnosis  Name Primary?  . Malignant neoplasm of upper-inner quadrant of left breast in female, estrogen receptor positive ([place])     SUMMARY OF ONCOLOGIC HISTORY:   Malignant neoplasm of upper-inner quadrant of left breast in female, estrogen receptor positive ([place])   09/13/2016 Initial Diagnosis    Left breast biopsy UIQ: Grade 1 invasive ductal carcinoma ER 95%, PR 90%, Ki-67 15%, HER-2 negative ratio 1.41; screening detected left breast mass at 11:30 position 6 mm size, axilla negative, T1 BN 0 stage IA clinical stage      09/27/2016 Surgery    Left Lumpectomy: IDC 2 tumors 1.2 cm and 1 cm, 0/3 LN Neg, Positive posterior margin (Muscle),  Er 95%, PR 90% Ki67: 15%, Her 2 Neg, T1bN0 Stage 1A      10/08/2016 Oncotype testing    Oncotype DX score 18: Risk of recurrence with tamoxifen for 5 years at 12%, low risk      11/20/2016 - 01/07/2017 Radiation Therapy    Adjuvant radiation therapy       CHIEF COMPLIANT: Last day of radiation therapy  INTERVAL HISTORY: Bonnie Marquez is a 56 year old with above-mentioned history of left breast cancer who underwent lumpectomy and radiation therapy and is currently here to discuss the role of adjuvant antiestrogen therapy. She has done fairly well from the radiation effects. She is anxious to finish today's treatment and be done with it.  REVIEW OF SYSTEMS:   Constitutional: Denies fevers, chills or abnormal weight loss Eyes: Denies blurriness of vision Ears, nose, mouth, throat, and face: Denies mucositis or sore throat Respiratory: Denies cough, dyspnea or wheezes Cardiovascular: Denies palpitation, chest discomfort Gastrointestinal:  Denies nausea, heartburn or change in bowel habits Skin: Denies abnormal skin rashes Lymphatics: Denies new lymphadenopathy or easy bruising Neurological:Denies numbness, tingling or new  weaknesses Behavioral/Psych: Mood is stable, no new changes  Extremities: No lower extremity edema Breast: Radiation dermatitis All other systems were reviewed with the patient and are negative.  I have reviewed the past medical history, past surgical history, social history and family history with the patient and they are unchanged from previous note.  ALLERGIES:  is allergic to codeine.  MEDICATIONS:  Current Outpatient Prescriptions  Medication Sig Dispense Refill  . Diethylpropion HCl CR 75 MG TB24 Take by mouth.    . emollient (BIAFINE) cream Apply topically 2 (two) times daily.    Marland Kitchen gabapentin (NEURONTIN) 100 MG capsule TK 1 C PO TID  3  . hyaluronate sodium (RADIAPLEXRX) GEL Apply 1 application topically 2 (two) times daily.    Marland Kitchen HYDROcodone-acetaminophen (NORCO/VICODIN) 5-325 MG tablet Take 1-2 tablets by mouth every 4 (four) hours as needed for moderate pain or severe pain. (Patient not taking: Reported on 12/18/2016) 20 tablet 0  . non-metallic deodorant (ALRA) MISC Apply 1 application topically daily as needed.    . traMADol (ULTRAM) 50 MG tablet Take 1-2 tablets (50-100 mg total) by mouth every 6 (six) hours as needed. (Patient not taking: Reported on 11/02/2016) 20 tablet 0  . triamterene-hydrochlorothiazide (DYAZIDE) 37.5-25 MG capsule Take 1 capsule by mouth daily.    Marland Kitchen zolpidem (AMBIEN) 5 MG tablet TK 1 T PO AT BEDTIME PRN FOR SLEEP  2   No current facility-administered medications for this visit.     PHYSICAL EXAMINATION: ECOG PERFORMANCE STATUS: 1 - Symptomatic but completely ambulatory  Vitals:   01/07/17 1309  BP: Marland Kitchen)  153/99  Pulse: 76  Resp: 18  Temp: 98 F (36.7 C)   Filed Weights    GENERAL:alert, no distress and comfortable SKIN: skin color, texture, turgor are normal, no rashes or significant lesions EYES: normal, Conjunctiva are pink and non-injected, sclera clear OROPHARYNX:no exudate, no erythema and lips, buccal mucosa, and tongue normal  NECK:  supple, thyroid normal size, non-tender, without nodularity LYMPH:  no palpable lymphadenopathy in the cervical, axillary or inguinal LUNGS: clear to auscultation and percussion with normal breathing effort HEART: regular rate & rhythm and no murmurs and no lower extremity edema ABDOMEN:abdomen soft, non-tender and normal bowel sounds MUSCULOSKELETAL:no cyanosis of digits and no clubbing  NEURO: alert & oriented x 3 with fluent speech, no focal motor/sensory deficits EXTREMITIES: No lower extremity edema  LABORATORY DATA:  I have reviewed the data as listed   Chemistry      Component Value Date/Time   NA 138 09/19/2016 0821   K 3.9 09/19/2016 0821   CO2 27 09/19/2016 0821   BUN 15.2 09/19/2016 0821   CREATININE 0.8 09/19/2016 0821      Component Value Date/Time   CALCIUM 10.6 (H) 09/19/2016 0821   ALKPHOS 78 09/19/2016 0821   AST 50 (H) 09/19/2016 0821   ALT 57 (H) 09/19/2016 0821   BILITOT 1.28 (H) 09/19/2016 0821       Lab Results  Component Value Date   WBC 5.5 09/19/2016   HGB 14.4 09/19/2016   HCT 42.7 09/19/2016   MCV 94.5 09/19/2016   PLT 254 09/19/2016   NEUTROABS 3.2 09/19/2016    ASSESSMENT & PLAN:  Malignant neoplasm of upper-inner quadrant of left breast in female, estrogen receptor positive ([place]) Left Lumpectomy 09/27/16: IDC 2 tumors 1.2 cm and 1 cm, 0/3 LN Neg, Positive posterior margin (Muscle),  Er 95%, PR 90% Ki67: 15%, Her 2 Neg, T1bN0 Stage 1A  Oncotype DX recurrence score 18: Risk of recurrence 12% Adjuvant radiation started 11/20/2016 completed 01/07/2017  Recommendation: Adjuvant antiestrogen therapy with Anastrozole 2.5 mg daily for 5-7 years Anastrozole counseling:We discussed the risks and benefits of anti-estrogen therapy with aromatase inhibitors. These include but not limited to insomnia, hot flashes, mood changes, vaginal dryness, bone density loss, and weight gain. We strongly believe that the benefits far outweigh the risks. Patient  understands these risks and consented to starting treatment. Planned treatment duration is 5-7 years.  Return to clinic in 3 months for toxicity check and follow-up  I spent 25 minutes talking to the patient of which more than half was spent in counseling and coordination of care.  No orders of the defined types were placed in this encounter.  The patient has a good understanding of the overall plan. she agrees with it. she will call with any problems that may develop before the next visit here.   Rulon Eisenmenger, MD 01/07/17

## 2017-01-07 NOTE — Assessment & Plan Note (Signed)
Left Lumpectomy 09/27/16: IDC 2 tumors 1.2 cm and 1 cm, 0/3 LN Neg, Positive posterior margin (Muscle),  Er 95%, PR 90% Ki67: 15%, Her 2 Neg, T1bN0 Stage 1A  Oncotype DX recurrence score 18: Risk of recurrence 12% Adjuvant radiation started 11/20/2016 completed 01/07/2017  Recommendation: Adjuvant antiestrogen therapy with letrozole 2.5 mg daily for 5-7 years Letrozole counseling:We discussed the risks and benefits of anti-estrogen therapy with aromatase inhibitors. These include but not limited to insomnia, hot flashes, mood changes, vaginal dryness, bone density loss, and weight gain. We strongly believe that the benefits far outweigh the risks. Patient understands these risks and consented to starting treatment. Planned treatment duration is 5-7 years.  Return to clinic in 3 months for toxicity check and follow-up

## 2017-01-08 ENCOUNTER — Telehealth: Payer: Self-pay | Admitting: *Deleted

## 2017-01-08 NOTE — Telephone Encounter (Signed)
Called pt to congratulate on completion of xrt. Relate doing well and without needs. Encourage pt to call with questions. Received verbal understanding. Discussed survivorship.

## 2017-01-09 ENCOUNTER — Encounter: Payer: Self-pay | Admitting: Radiation Oncology

## 2017-01-09 NOTE — Progress Notes (Signed)
  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  During treatment, the patient reported shooting/stabbing pains in her left breast and fatigue. She developed brisk reaction but no moist desquamation.  Plan: The patient has completed radiation treatment. The patient will return to radiation oncology clinic for routine followup in one month. I advised them to call or return sooner if they have any questions or concerns related to their recovery or treatment.  -----------------------------------  Blair Promise, PhD, MD  This document serves as a record of services personally performed by Gery Pray, MD. It was created on his behalf by Bethann Humble, a trained medical scribe. The creation of this record is based on the scribe's personal observations and the provider's statements to them. This document has been checked and approved by the attending provider.

## 2017-01-12 ENCOUNTER — Telehealth: Payer: Self-pay | Admitting: Adult Health

## 2017-01-12 NOTE — Telephone Encounter (Signed)
Scheduled appt for SCP . Unable to reach patient , lef tmessage and sent letter.

## 2017-02-18 ENCOUNTER — Telehealth: Payer: Self-pay

## 2017-02-18 NOTE — Telephone Encounter (Signed)
Spoke to pt and states that she has been experiencing some joint pain and leg weakness, since starting her anastrozole 5 weeks ago. Pt states that she started taking in the morning and it made her nauseated all day, so she had switched taking it at night, instead. Now pt is having fatigue, joint and leg weakness. Denies any other complaints at this time. Pt believes that it is coming from her anastrozole. She also adds that her breast is intermittently sore at times. Told pt that since there is a fluctuation in hormones, she can expect to experience all these symptoms. Pt noted to be on water pill for bp as well. Told pt that she can also be low on potassium and encouraged pt to hydrate with electrolytes and increase potassium rich food intake. Also, suggested for pt to contact her pcp to notify them of leg weakness as well. Advised that pt stop taking anastrozole for 1 week to see if her symptoms improve. Instructed for pt to call us back to update of her improvements. Pt anxious about stopping anastrozole because her cancer may come back. Reassured pt that stopping for 1 week will not make her cancer come back. Pt agreeable to plan and will call with updates. Pt also will contact her pcp to let them know of her symptoms.No further questions/concerns at this time.

## 2017-02-21 ENCOUNTER — Ambulatory Visit: Payer: Self-pay | Admitting: Radiation Oncology

## 2017-02-26 ENCOUNTER — Telehealth: Payer: Self-pay | Admitting: Emergency Medicine

## 2017-02-26 NOTE — Telephone Encounter (Signed)
Patient called with concerns in regards to upcoming drug testing for her job; reassured patient that taking Anastrozole would not be a concern in regards to drug testing.  Patient states she does feel better; states she is taking over the counter Potassium and is trying to increase her fluid intake.   Advised patient to call for any concerns or changes and to keep and future appointments.

## 2017-04-08 ENCOUNTER — Other Ambulatory Visit: Payer: Self-pay

## 2017-04-08 ENCOUNTER — Telehealth: Payer: Self-pay

## 2017-04-08 DIAGNOSIS — R05 Cough: Secondary | ICD-10-CM

## 2017-04-08 DIAGNOSIS — C50212 Malignant neoplasm of upper-inner quadrant of left female breast: Secondary | ICD-10-CM

## 2017-04-08 DIAGNOSIS — R059 Cough, unspecified: Secondary | ICD-10-CM

## 2017-04-08 DIAGNOSIS — Z170 Estrogen receptor positive status [ER+]: Principal | ICD-10-CM

## 2017-04-08 MED ORDER — BENZONATATE 100 MG PO CAPS: 100.0000 mg | ORAL_CAPSULE | Freq: Three times a day (TID) | ORAL | 0 refills | Status: DC | PRN

## 2017-04-08 NOTE — Telephone Encounter (Signed)
Called and returned pt vm. Regarding rescheduling her appt this week to next month. Pt states that she is waiting for her new insurance to take effect starting in July and would like to reschedule with Dr.Gudena then. Scheduled pt for the 2nd week of July per request. Pt doing well with anastrozole. Pt did report that she had noticed a non productive dry cough that has been going on for 3 weeks now. It is worse at night. Pt states that she went to her pcp and was prescribed antibiotic 2 weeks ago, but still experiencing this dry cough. Pt states that she has tried delsym and robitussin at night but it wasn't effective at all and pt is losing sleep. Will discuss with Dr.Gudena if ok to send in for tessalon pearl for pt or steroid. Pt had radiation 3 months ago and had developed this very dry cough 3 weeks ago. Pt would also like to have some genetic testing scheduled if possible. Pt states that she had declined before but changed her mind. Will call pt back with updates once Dr.Gudena notified of pt request and concerns.Pt verbalized understanding.

## 2017-04-08 NOTE — Progress Notes (Signed)
Called pt to let her know that an order for tessalon perle was obtained for pt cough. Dr.Gudena would like for pt to have a chest CT done next month to rule out pneumonitis. LVM and call back number.

## 2017-04-09 ENCOUNTER — Telehealth: Payer: Self-pay

## 2017-04-09 NOTE — Telephone Encounter (Signed)
Called and spoke with pt to confirm her ct in July prior to seeing Dr.Gudena. Pt advised to bring in new insurance card to update her information. Pt verbalized understanding and confirmed time/date of ct.

## 2017-04-10 ENCOUNTER — Encounter: Payer: No Typology Code available for payment source | Admitting: Genetic Counselor

## 2017-04-10 ENCOUNTER — Ambulatory Visit: Payer: No Typology Code available for payment source | Admitting: Hematology and Oncology

## 2017-04-10 ENCOUNTER — Other Ambulatory Visit: Payer: No Typology Code available for payment source

## 2017-04-18 ENCOUNTER — Telehealth: Payer: Self-pay | Admitting: Hematology and Oncology

## 2017-04-18 NOTE — Telephone Encounter (Signed)
Patient is having a CT Scan on Tuesday 6/26 and wanted to know if Dr Lindi Adie could see her after that at the end of the day.  Patient states that it is hard for her to get off work due to having to take time off for radiation

## 2017-04-19 ENCOUNTER — Encounter: Payer: Self-pay | Admitting: Hematology and Oncology

## 2017-04-22 ENCOUNTER — Telehealth: Payer: Self-pay | Admitting: Hematology and Oncology

## 2017-04-22 NOTE — Telephone Encounter (Signed)
Received call from Purdy at [facility] re patient Bonnie Marquez messages and provider responses coming/going to the wrong department (hers). Per Ja'ya she forwarded the messages to the providers, however their responses are coming back to her and this particular message needs action.    Patient sent mychart message to [facility] 6/22 requesting to see after her 6/26 ct. VG's response was to schedule patient with him right after ct.  Spoke with patient re f/u with VG tomorrow after ct at 2:30 pm.   Per Ja'ya they have contacted [facility] and the issue is being evaluated.

## 2017-04-23 ENCOUNTER — Encounter: Payer: Self-pay | Admitting: Hematology and Oncology

## 2017-04-23 ENCOUNTER — Ambulatory Visit (HOSPITAL_BASED_OUTPATIENT_CLINIC_OR_DEPARTMENT_OTHER): Payer: Managed Care, Other (non HMO) | Admitting: Hematology and Oncology

## 2017-04-23 ENCOUNTER — Ambulatory Visit (HOSPITAL_COMMUNITY): Admission: RE | Admit: 2017-04-23 | Payer: Managed Care, Other (non HMO) | Source: Ambulatory Visit

## 2017-04-23 VITALS — BP 174/100 | HR 80 | Temp 99.1°F | Resp 17 | Ht 64.0 in

## 2017-04-23 DIAGNOSIS — M26621 Arthralgia of right temporomandibular joint: Secondary | ICD-10-CM | POA: Diagnosis not present

## 2017-04-23 DIAGNOSIS — Z79811 Long term (current) use of aromatase inhibitors: Secondary | ICD-10-CM | POA: Diagnosis not present

## 2017-04-23 DIAGNOSIS — C50212 Malignant neoplasm of upper-inner quadrant of left female breast: Secondary | ICD-10-CM

## 2017-04-23 DIAGNOSIS — R05 Cough: Secondary | ICD-10-CM | POA: Diagnosis not present

## 2017-04-23 DIAGNOSIS — Z170 Estrogen receptor positive status [ER+]: Secondary | ICD-10-CM

## 2017-04-23 DIAGNOSIS — R053 Chronic cough: Secondary | ICD-10-CM

## 2017-04-23 MED ORDER — BENZONATATE 100 MG PO CAPS: 100.0000 mg | ORAL_CAPSULE | Freq: Three times a day (TID) | ORAL | 1 refills | Status: DC | PRN

## 2017-04-23 NOTE — Assessment & Plan Note (Signed)
Left Lumpectomy 09/27/16: IDC 2 tumors 1.2 cm and 1 cm, 0/3 LN Neg, Positive posterior margin (Muscle), Er 95%, PR 90% Ki67: 15%, Her 2 Neg, T1bN0 Stage 1A  Oncotype DX recurrence score 18: Risk of recurrence 12% Adjuvant radiation started 11/20/2016 completed 01/07/2017  Recommendation: Adjuvant antiestrogen therapy with Anastrozole 2.5 mg daily for 5-7 years started 01/07/2017  Anastrozole toxicities:  Monitoring closely for anastrozole toxicities Return to clinic in 6 months for follow-up

## 2017-04-23 NOTE — Progress Notes (Signed)
Patient Care Team: Vania Rea, MD as PCP - General (Obstetrics and Gynecology)  DIAGNOSIS:  Encounter Diagnosis  Name Primary?  . Malignant neoplasm of upper-inner quadrant of left breast in female, estrogen receptor positive ([place])     SUMMARY OF ONCOLOGIC HISTORY:   Malignant neoplasm of upper-inner quadrant of left breast in female, estrogen receptor positive ([place])   09/13/2016 Initial Diagnosis    Left breast biopsy UIQ: Grade 1 invasive ductal carcinoma ER 95%, PR 90%, Ki-67 15%, HER-2 negative ratio 1.41; screening detected left breast mass at 11:30 position 6 mm size, axilla negative, T1 BN 0 stage IA clinical stage      09/27/2016 Surgery    Left Lumpectomy: IDC 2 tumors 1.2 cm and 1 cm, 0/3 LN Neg, Positive posterior margin (Muscle),  Er 95%, PR 90% Ki67: 15%, Her 2 Neg, T1bN0 Stage 1A      10/08/2016 Oncotype testing    Oncotype DX score 18: Risk of recurrence with tamoxifen for 5 years at 12%, low risk      11/20/2016 - 01/07/2017 Radiation Therapy    Adjuvant radiation therapy       CHIEF COMPLIANT: Complains of chronic cough, Rt TMJ pain, Fatigue  INTERVAL HISTORY: Bonnie Marquez is a 56 yr old with Above-mentioned history of left breast cancer treated with lumpectomy followed by radiation therapy. She has been complaining of a chronic dry cough for the past several months. We initially treated her with antibiotics without any response. We tried to obtain a CT of the chest but her insurance denied it until we get a chest x-ray. She also complains of right temporomandibular joint pain of unclear reason. She is extremely anxious and paranoid about breast cancer returning back. She attends of the respiratory symptoms could be related to breast cancer recurrence.  REVIEW OF SYSTEMS:   Constitutional: Denies fevers, chills or abnormal weight loss Eyes: Denies blurriness of vision Ears, nose, mouth, throat, and face: Chronic cough, complains of fatigue Respiratory:  Denies cough, dyspnea or wheezes Cardiovascular: Denies palpitation, chest discomfort Gastrointestinal:  Denies nausea, heartburn or change in bowel habits Skin: Denies abnormal skin rashes Lymphatics: Denies new lymphadenopathy or easy bruising Neurological:Denies numbness, tingling or new weaknesses Behavioral/Psych: Mood is stable, no new changes  Extremities: No lower extremity edema All other systems were reviewed with the patient and are negative.  I have reviewed the past medical history, past surgical history, social history and family history with the patient and they are unchanged from previous note.  ALLERGIES:  is allergic to codeine.  MEDICATIONS:  Current Outpatient Prescriptions  Medication Sig Dispense Refill  . anastrozole (ARIMIDEX) 1 MG tablet Take 1 tablet (1 mg total) by mouth daily. 90 tablet 3  . benzonatate (TESSALON) 100 MG capsule Take 1 capsule (100 mg total) by mouth 3 (three) times daily as needed for cough. 40 capsule 0  . Diethylpropion HCl CR 75 MG TB24 Take by mouth.    . emollient (BIAFINE) cream Apply topically 2 (two) times daily.    Marland Kitchen gabapentin (NEURONTIN) 100 MG capsule TK 1 C PO TID  3  . hyaluronate sodium (RADIAPLEXRX) GEL Apply 1 application topically 2 (two) times daily.    Marland Kitchen HYDROcodone-acetaminophen (NORCO/VICODIN) 5-325 MG tablet Take 1-2 tablets by mouth every 4 (four) hours as needed for moderate pain or severe pain. (Patient not taking: Reported on 12/18/2016) 20 tablet 0  . non-metallic deodorant (ALRA) MISC Apply 1 application topically daily as needed.    Marland Kitchen  traMADol (ULTRAM) 50 MG tablet Take 1-2 tablets (50-100 mg total) by mouth every 6 (six) hours as needed. (Patient not taking: Reported on 11/02/2016) 20 tablet 0  . triamterene-hydrochlorothiazide (DYAZIDE) 37.5-25 MG capsule Take 1 capsule by mouth daily.    Marland Kitchen zolpidem (AMBIEN) 5 MG tablet TK 1 T PO AT BEDTIME PRN FOR SLEEP  2   No current facility-administered medications for this  visit.     PHYSICAL EXAMINATION: ECOG PERFORMANCE STATUS: 1 - Symptomatic but completely ambulatory  Vitals:   04/23/17 1432  BP: (!) 174/100  Pulse: 80  Resp: 17  Temp: 99.1 F (37.3 C)   There were no vitals filed for this visit.  GENERAL:alert, no distress and comfortable SKIN: skin color, texture, turgor are normal, no rashes or significant lesions EYES: normal, Conjunctiva are pink and non-injected, sclera clear OROPHARYNX:no exudate, no erythema and lips, buccal mucosa, and tongue normal  NECK: supple, thyroid normal size, non-tender, without nodularity LYMPH:  no palpable lymphadenopathy in the cervical, axillary or inguinal LUNGS: clear to auscultation and percussion with normal breathing effort HEART: regular rate & rhythm and no murmurs and no lower extremity edema ABDOMEN:abdomen soft, non-tender and normal bowel sounds MUSCULOSKELETAL:no cyanosis of digits and no clubbing  NEURO: alert & oriented x 3 with fluent speech, no focal motor/sensory deficits  LABORATORY DATA:  I have reviewed the data as listed   Chemistry      Component Value Date/Time   NA 138 09/19/2016 0821   K 3.9 09/19/2016 0821   CO2 27 09/19/2016 0821   BUN 15.2 09/19/2016 0821   CREATININE 0.8 09/19/2016 0821      Component Value Date/Time   CALCIUM 10.6 (H) 09/19/2016 0821   ALKPHOS 78 09/19/2016 0821   AST 50 (H) 09/19/2016 0821   ALT 57 (H) 09/19/2016 0821   BILITOT 1.28 (H) 09/19/2016 0821       Lab Results  Component Value Date   WBC 5.5 09/19/2016   HGB 14.4 09/19/2016   HCT 42.7 09/19/2016   MCV 94.5 09/19/2016   PLT 254 09/19/2016   NEUTROABS 3.2 09/19/2016    ASSESSMENT & PLAN:  Malignant neoplasm of upper-inner quadrant of left breast in female, estrogen receptor positive ([place]) Left Lumpectomy 09/27/16: IDC 2 tumors 1.2 cm and 1 cm, 0/3 LN Neg, Positive posterior margin (Muscle), Er 95%, PR 90% Ki67: 15%, Her 2 Neg, T1bN0 Stage 1A  Oncotype DX recurrence score  18: Risk of recurrence 12% Adjuvant radiation started 11/20/2016 completed 01/07/2017  Recommendation: Adjuvant antiestrogen therapy with Anastrozole 2.5 mg daily for 5-7 years started 01/07/2017  Anastrozole toxicities: Tolerating it very well without any major problems. Monitoring closely for anastrozole toxicities Chronic cough: Could be related to acid reflux disease versus radiation pneumonitis. We will try to obtain a chest x-ray to evaluate this further. If the chest x-ray shows some abnormalities and might consider obtaining a CT chest.  I will call her with the result of a chest x-ray.  I tried to reassure the patient that malignancy is not on the top of the differential diagnosis at this time. We will see the imaging studies and determine further steps.  Right temporomandibular joint pain: I asked her to see her dentist for follow-up.  Return to clinic in 6 months for follow-up   I spent 25 minutes talking to the patient of which more than half was spent in counseling and coordination of care.  No orders of the defined types were placed in this  encounter.  The patient has a good understanding of the overall plan. she agrees with it. she will call with any problems that may develop before the next visit here.   Rulon Eisenmenger, MD 04/23/17

## 2017-04-24 ENCOUNTER — Ambulatory Visit (HOSPITAL_COMMUNITY)
Admission: RE | Admit: 2017-04-24 | Discharge: 2017-04-24 | Disposition: A | Discharge status: Home / Self Care | Payer: Managed Care, Other (non HMO) | Source: Ambulatory Visit | Attending: Hematology and Oncology | Admitting: Hematology and Oncology

## 2017-04-24 DIAGNOSIS — R918 Other nonspecific abnormal finding of lung field: Secondary | ICD-10-CM | POA: Diagnosis not present

## 2017-04-24 DIAGNOSIS — R053 Chronic cough: Secondary | ICD-10-CM

## 2017-04-24 DIAGNOSIS — R05 Cough: Secondary | ICD-10-CM | POA: Diagnosis present

## 2017-04-26 ENCOUNTER — Telehealth: Payer: Self-pay | Admitting: Hematology and Oncology

## 2017-04-26 ENCOUNTER — Telehealth: Payer: Self-pay

## 2017-04-26 NOTE — Telephone Encounter (Signed)
Pt called lvm to get xray results. Dr.Gudena notified and call pt with results. Will reschedule July appt to 3 months (sept). No other interventions at this time. Called pt back and gave her new appt in Sept. Pt confirmed time/date verbally over the phone.

## 2017-04-26 NOTE — Telephone Encounter (Signed)
I discussed the Xray report and informed her it showed radiation related lung scarring If her cough doesn't get better in 2 weeks, she will call back and we may have to treat her with a short coarse of steroids We will move her July appt to 3 months

## 2017-05-03 ENCOUNTER — Ambulatory Visit (HOSPITAL_COMMUNITY): Payer: No Typology Code available for payment source

## 2017-05-06 DIAGNOSIS — C50912 Malignant neoplasm of unspecified site of left female breast: Secondary | ICD-10-CM | POA: Insufficient documentation

## 2017-05-09 ENCOUNTER — Ambulatory Visit: Payer: No Typology Code available for payment source | Admitting: Hematology and Oncology

## 2017-05-16 ENCOUNTER — Encounter: Payer: No Typology Code available for payment source | Admitting: Adult Health

## 2017-05-23 ENCOUNTER — Telehealth: Payer: Self-pay

## 2017-05-23 NOTE — Telephone Encounter (Signed)
Called pt to return vm regarding a recent fall she had. Pt states that she fell outside of her home a week or so ago. Pt has a bruise on left hip area. Pt denies fevers, chills, redness, swelling or open areas. Pt states that she has a sharp shooting pain when she bears weight or walks. Pt wanted to know if she could take OTC pain medications. Told pt that because she is on anastrozole, she is higher risk for bone fractures. Advised pt to contact her pcp and have an xray performed in order to make sure there is no fracture. Pt verbalized understanding.Advised pt to elevate leg and to not bear so much weight on affected hip until further instructions from primary MD. Pt verbalized understanding.

## 2017-05-30 ENCOUNTER — Other Ambulatory Visit: Payer: Self-pay | Admitting: Emergency Medicine

## 2017-05-30 MED ORDER — ANASTROZOLE 1 MG PO TABS: 1.0000 mg | ORAL_TABLET | Freq: Every day | ORAL | 3 refills | Status: DC

## 2017-07-05 IMAGING — US US BREAST BX W LOC DEV 1ST LESION IMG BX SPEC US GUIDE*L*
1 series · 13 of 13 positions shown · non-contrast
Comparison: Previous exam(s).

ADDENDUM:
Pathology revealed grade I invasive ductal carcinoma and ductal
carcinoma in situ in the left breast. This was found to be
concordant by Dr. Rishi De Lara. Pathology results were
discussed with the patient by telephone. The patient reported doing
well after the biopsy. Post biopsy instructions and care were
reviewed and questions were answered. The patient was encouraged to
call The [REDACTED] for any additional
concerns. The patient was referred to [REDACTED]
[REDACTED] at the [HOSPITAL] [HOSPITAL] on
September 19, 2016. The patient is scheduled for a left stereotactic
biopsy on September 13, 2016.

Pathology results reported by Stepanich Phamilia RN, BSN on 09/11/2016.
CLINICAL DATA: Left breast 11:30 o'clock nodule.
EXAM:
ULTRASOUND GUIDED LEFT BREAST CORE NEEDLE BIOPSY

[Series 1: us breast bx w loc dev 1st lesion img bx spec us g · 0.04mm/px · 13 of 13 slices shown]
[im 1/13]
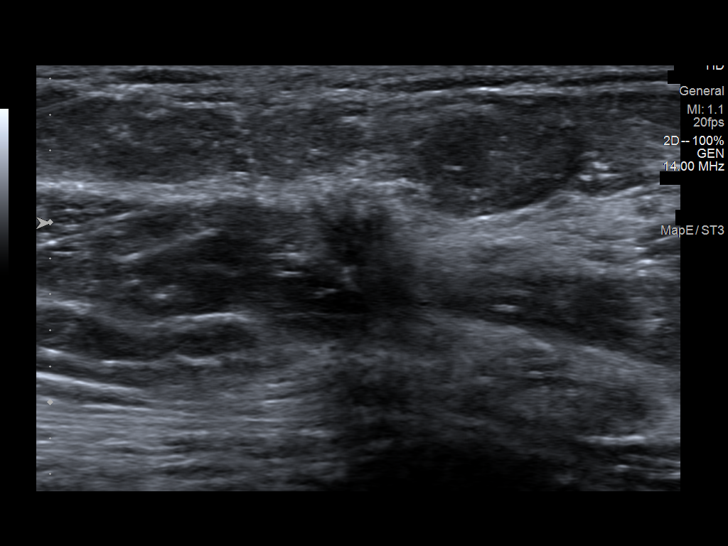
[im 2/13]
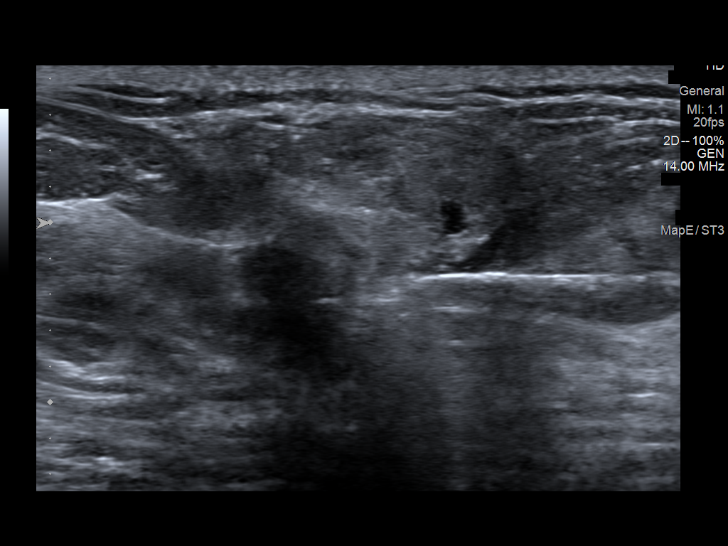
[im 3/13]
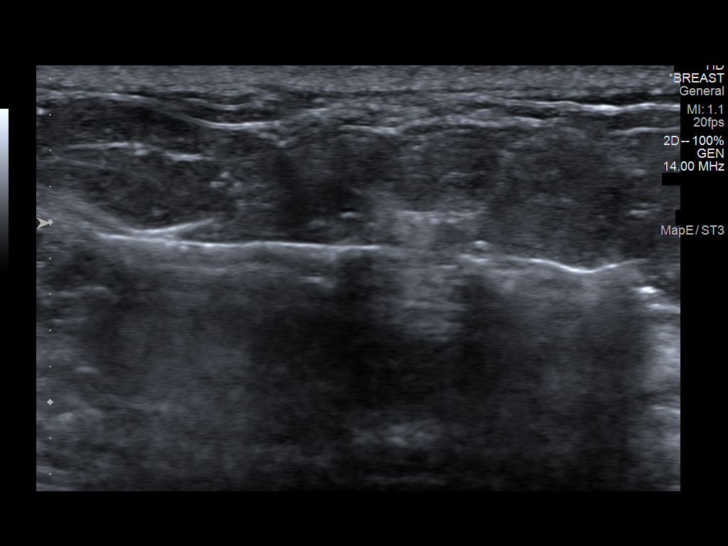
[im 4/13]
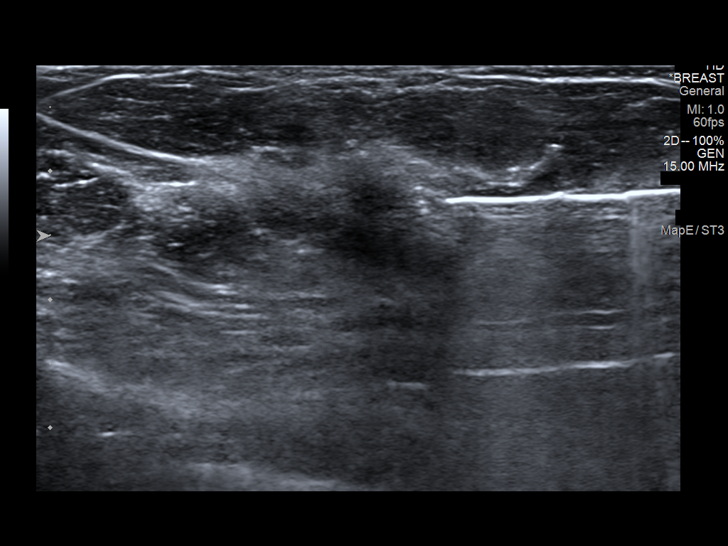
[im 5/13]
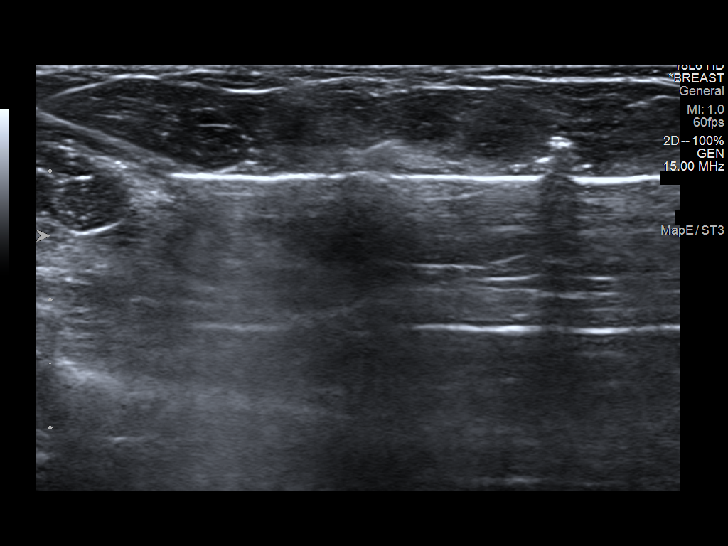
[im 6/13]
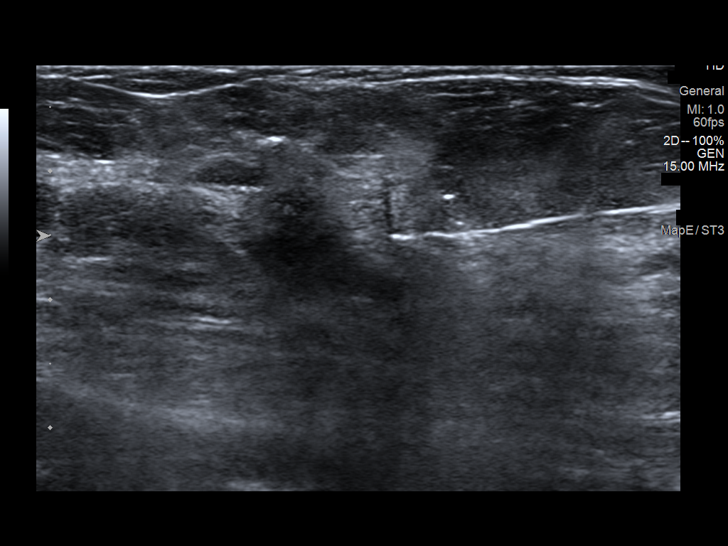
[im 7/13]
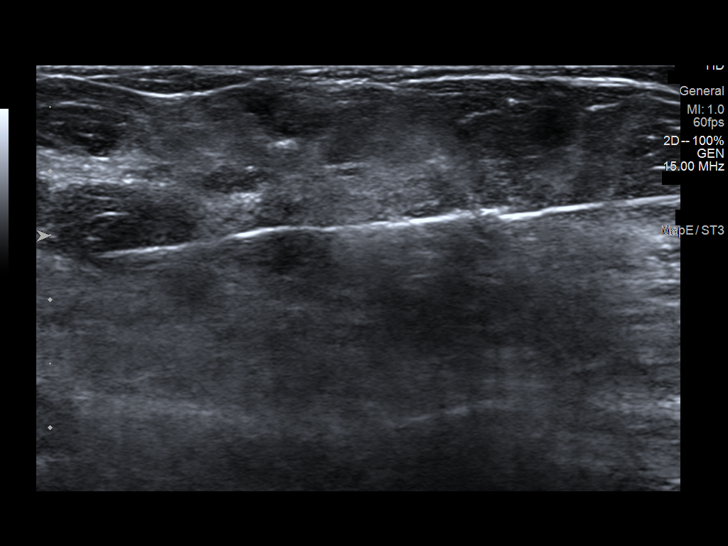
[im 8/13]
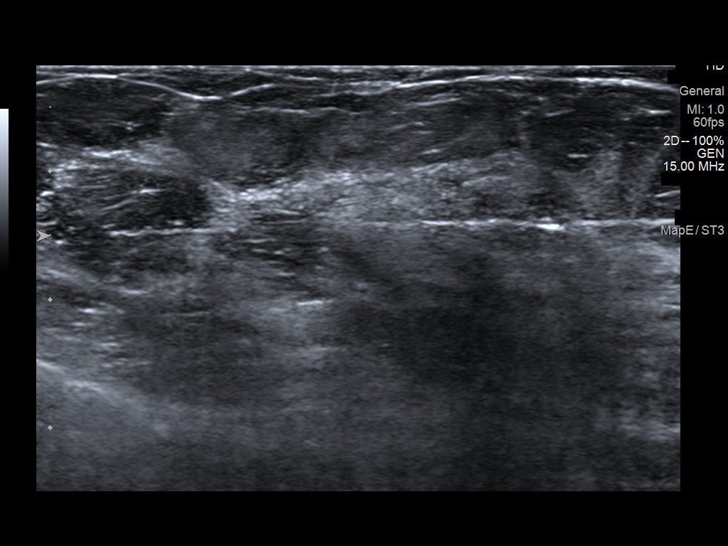
[im 9/13]
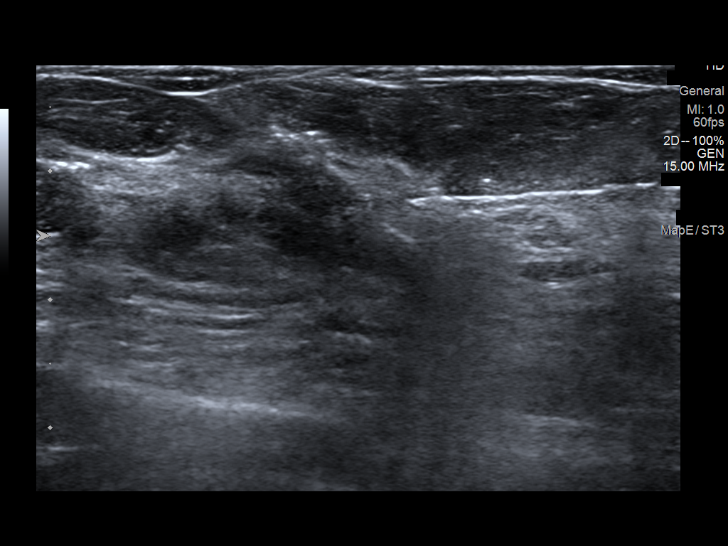
[im 10/13]
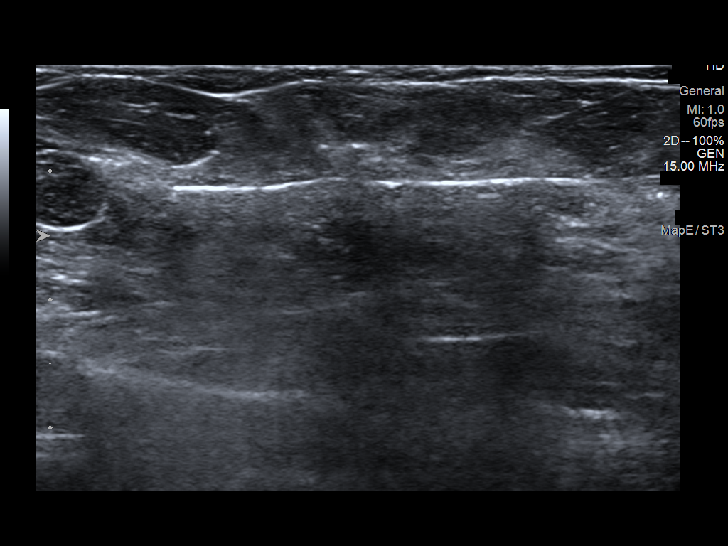
[im 11/13]
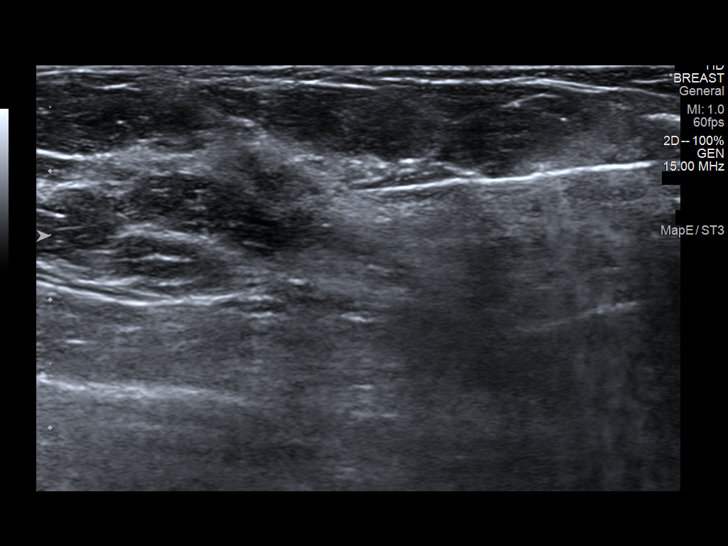
[im 12/13]
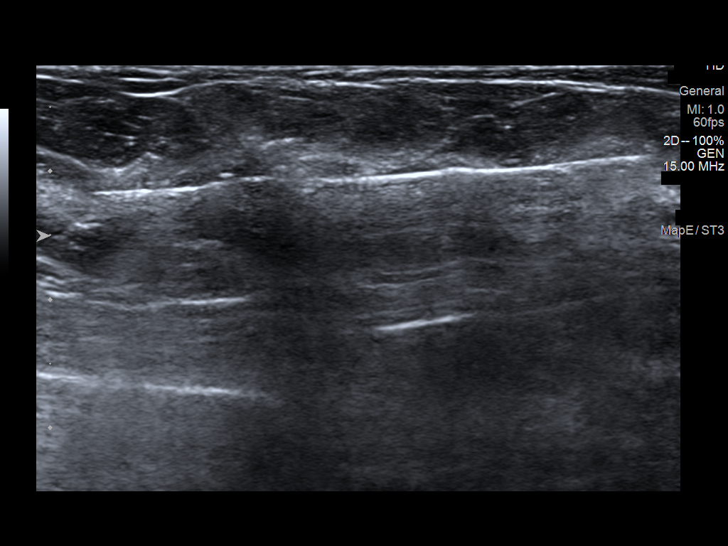
[im 13/13]
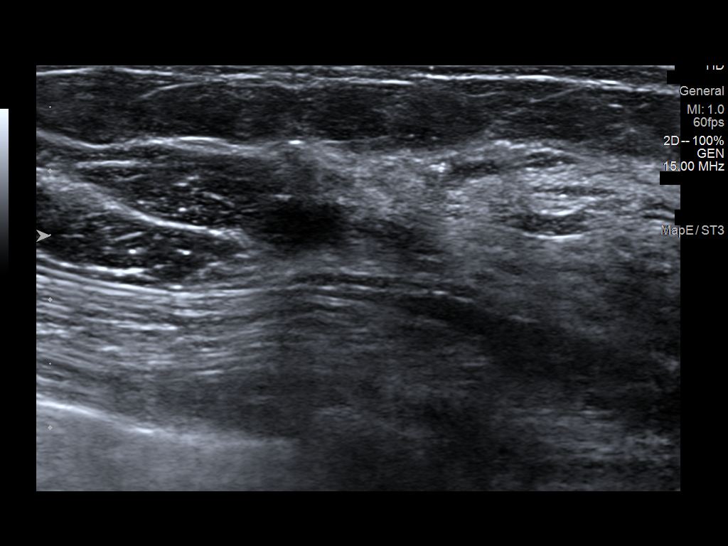

[13 of 13 positions shown; findings below may reference images not displayed]



Using sterile technique and 1% Lidocaine as local anesthetic, under
direct ultrasound visualization, a 14 gauge Shaneka device was
used to perform biopsy of left breast nodule using a lateral
approach. At the conclusion of the procedure a ribbon shaped tissue
marker clip was deployed into the biopsy cavity. Follow up 2 view
mammogram was performed and dictated separately.
IMPRESSION: Ultrasound guided biopsy of left breast 11:30 o'clock nodule.

According to the location of the ribbon shaped marker on the
postprocedure mammogram, the sonographically demonstrated an
biopsied nodule does not correspond to the original mammographically
identified nodule. This was discussed with the patient and she is
scheduled for a stereotactic biopsy on September 13, 2016.

## 2017-07-11 ENCOUNTER — Ambulatory Visit (HOSPITAL_BASED_OUTPATIENT_CLINIC_OR_DEPARTMENT_OTHER): Payer: Managed Care, Other (non HMO) | Admitting: Hematology and Oncology

## 2017-07-11 ENCOUNTER — Telehealth: Payer: Self-pay | Admitting: Hematology and Oncology

## 2017-07-11 DIAGNOSIS — Z79811 Long term (current) use of aromatase inhibitors: Secondary | ICD-10-CM | POA: Diagnosis not present

## 2017-07-11 DIAGNOSIS — R109 Unspecified abdominal pain: Secondary | ICD-10-CM | POA: Diagnosis not present

## 2017-07-11 DIAGNOSIS — Z170 Estrogen receptor positive status [ER+]: Secondary | ICD-10-CM

## 2017-07-11 DIAGNOSIS — Z7289 Other problems related to lifestyle: Secondary | ICD-10-CM | POA: Diagnosis not present

## 2017-07-11 DIAGNOSIS — C50212 Malignant neoplasm of upper-inner quadrant of left female breast: Secondary | ICD-10-CM

## 2017-07-11 DIAGNOSIS — R1031 Right lower quadrant pain: Secondary | ICD-10-CM

## 2017-07-11 DIAGNOSIS — M791 Myalgia: Secondary | ICD-10-CM | POA: Diagnosis not present

## 2017-07-11 DIAGNOSIS — R0602 Shortness of breath: Secondary | ICD-10-CM

## 2017-07-11 MED ORDER — LETROZOLE 2.5 MG PO TABS: 2.5000 mg | ORAL_TABLET | Freq: Every day | ORAL | 0 refills | Status: DC

## 2017-07-11 NOTE — Progress Notes (Signed)
Patient Care Team: Vania Rea, MD as PCP - General (Obstetrics and Gynecology)  DIAGNOSIS:  Encounter Diagnoses  Name Primary?  . Malignant neoplasm of upper-inner quadrant of left breast in female, estrogen receptor positive ([place])   . Shortness of breath   . Right lower quadrant abdominal pain     SUMMARY OF ONCOLOGIC HISTORY:   Malignant neoplasm of upper-inner quadrant of left breast in female, estrogen receptor positive ([place])   09/13/2016 Initial Diagnosis    Left breast biopsy UIQ: Grade 1 invasive ductal carcinoma ER 95%, PR 90%, Ki-67 15%, HER-2 negative ratio 1.41; screening detected left breast mass at 11:30 position 6 mm size, axilla negative, T1 BN 0 stage IA clinical stage      09/27/2016 Surgery    Left Lumpectomy: IDC 2 tumors 1.2 cm and 1 cm, 0/3 LN Neg, Positive posterior margin (Muscle),  Er 95%, PR 90% Ki67: 15%, Her 2 Neg, T1bN0 Stage 1A      10/08/2016 Oncotype testing    Oncotype DX score 18: Risk of recurrence with tamoxifen for 5 years at 12%, low risk      11/20/2016 - 01/07/2017 Radiation Therapy    Adjuvant radiation therapy      01/09/2017 -  Anti-estrogen oral therapy    Anastrozole 1 mg by mouth daily switched to letrozole 2.5 mg by mouth daily 07/11/2017 due to arthralgias and myalgias       CHIEF COMPLIANT: Follow-up of anastrozole therapy  INTERVAL HISTORY: Bonnie Marquez is a 56 year old with above-mentioned history of left breast cancer treated with lumpectomy adjuvant radiation and is currently on antiestrogen therapy with anastrozole. She is complaining of diffuse body aches and pains. These have become quite significant than bothersome for her. She also multiple emotional problems including the concern that her breast cancer could relapse any time. She denies any lumps or nodules in the breast. She does have occasional abdominal pain as well as some shortness of breath to exertion.   REVIEW OF SYSTEMS:   Constitutional: Denies fevers,  chills or abnormal weight loss Eyes: Denies blurriness of vision Ears, nose, mouth, throat, and face: Denies mucositis or sore throat Respiratory: Denies cough, dyspnea or wheezes Cardiovascular: Denies palpitation, chest discomfort Gastrointestinal:  Denies nausea, heartburn or change in bowel habits Skin: Denies abnormal skin rashes Lymphatics: Denies new lymphadenopathy or easy bruising Neurological:Denies numbness, tingling or new weaknesses Behavioral/Psych: Mood is stable, no new changes  Extremities: No lower extremity edema, complaining of musculoskeletal aches and pains Breast:  denies any pain or lumps or nodules in either breasts All other systems were reviewed with the patient and are negative.  I have reviewed the past medical history, past surgical history, social history and family history with the patient and they are unchanged from previous note.  ALLERGIES:  is allergic to codeine.  MEDICATIONS:  Current Outpatient Prescriptions  Medication Sig Dispense Refill  . benzonatate (TESSALON) 100 MG capsule Take 1 capsule (100 mg total) by mouth 3 (three) times daily as needed for cough. 40 capsule 1  . Diethylpropion HCl CR 75 MG TB24 Take by mouth.    . letrozole (FEMARA) 2.5 MG tablet Take 1 tablet (2.5 mg total) by mouth daily. 30 tablet 0  . non-metallic deodorant (ALRA) MISC Apply 1 application topically daily as needed.    . triamterene-hydrochlorothiazide (DYAZIDE) 37.5-25 MG capsule Take 1 capsule by mouth daily.     No current facility-administered medications for this visit.     PHYSICAL EXAMINATION: ECOG  PERFORMANCE STATUS: 1 - Symptomatic but completely ambulatory  Vitals:   07/11/17 1459  BP: (!) 141/97  Pulse: 87  Resp: 18  Temp: 98.7 F (37.1 C)  SpO2: 100%   Filed Weights    GENERAL:alert, no distress and comfortable SKIN: skin color, texture, turgor are normal, no rashes or significant lesions EYES: normal, Conjunctiva are pink and  non-injected, sclera clear OROPHARYNX:no exudate, no erythema and lips, buccal mucosa, and tongue normal  NECK: supple, thyroid normal size, non-tender, without nodularity LYMPH:  no palpable lymphadenopathy in the cervical, axillary or inguinal LUNGS: clear to auscultation and percussion with normal breathing effort HEART: regular rate & rhythm and no murmurs and no lower extremity edema ABDOMEN:abdomen soft, non-tender and normal bowel sounds MUSCULOSKELETAL:no cyanosis of digits and no clubbing  NEURO: alert & oriented x 3 with fluent speech, no focal motor/sensory deficits EXTREMITIES: No lower extremity edema  LABORATORY DATA:  I have reviewed the data as listed   Chemistry      Component Value Date/Time   NA 138 09/19/2016 0821   K 3.9 09/19/2016 0821   CO2 27 09/19/2016 0821   BUN 15.2 09/19/2016 0821   CREATININE 0.8 09/19/2016 0821      Component Value Date/Time   CALCIUM 10.6 (H) 09/19/2016 0821   ALKPHOS 78 09/19/2016 0821   AST 50 (H) 09/19/2016 0821   ALT 57 (H) 09/19/2016 0821   BILITOT 1.28 (H) 09/19/2016 0821       Lab Results  Component Value Date   WBC 5.5 09/19/2016   HGB 14.4 09/19/2016   HCT 42.7 09/19/2016   MCV 94.5 09/19/2016   PLT 254 09/19/2016   NEUTROABS 3.2 09/19/2016    ASSESSMENT & PLAN:  Malignant neoplasm of upper-inner quadrant of left breast in female, estrogen receptor positive ([place]) Left Lumpectomy 09/27/16: IDC 2 tumors 1.2 cm and 1 cm, 0/3 LN Neg, Positive posterior margin (Muscle), Er 95%, PR 90% Ki67: 15%, Her 2 Neg, T1bN0 Stage 1A  Oncotype DX recurrence score 18: Risk of recurrence 12% Adjuvant radiation started 11/20/2016 completed 01/07/2017  Current treatment:Adjuvant antiestrogen therapy with Anastrozole 2.5 mg daily for 5-7 years started 01/07/2017 Switched to letrozole 07/11/2017 due to musculoskeletal aches and pains  Surveillance: 1. Breast exam 07/11/2017: Benign 2. mammograms: Will be done at the end of  October  I discussed with her about decreasing the alcohol intake as well as pursuing exercise and diet.  I spent 25 minutes talking to the patient of which more than half was spent in counseling and coordination of care.  Orders Placed This Encounter  Procedures  . MM DIAG BREAST TOMO BILATERAL    Annual diag/Pf 08/24/2016 bcg/no needs Ins[facility] [facility],[place] [id number] Epic order Cosigned//[facility]    Standing Status:   Future    Standing Expiration Date:   07/11/2018    Order Specific Question:   Reason for Exam (SYMPTOM  OR DIAGNOSIS REQUIRED)    Answer:   Annual Mammogram for breast cancer    Order Specific Question:   Is the patient pregnant?    Answer:   No    Order Specific Question:   Preferred imaging location?    Answer:   [facility]  . CT Chest W Contrast    Standing Status:   Future    Standing Expiration Date:   07/11/2018    Order Specific Question:   If indicated for the ordered procedure, I authorize the administration of contrast media per Radiology protocol  Answer:   Yes    Order Specific Question:   Is patient pregnant?    Answer:   No    Order Specific Question:   Preferred imaging location?    Answer:   [facility]    Order Specific Question:   Radiology Contrast Protocol - do NOT remove file path    Answer:   [email or web address]    Order Specific Question:   Reason for Exam additional comments    Answer:   chest and abdominal discomfort with H/O breast cancer  . CT Abdomen Pelvis W Contrast    Standing Status:   Future    Standing Expiration Date:   07/11/2018    Order Specific Question:   If indicated for the ordered procedure, I authorize the administration of contrast media per Radiology protocol    Answer:   Yes    Order Specific Question:   Is patient pregnant?    Answer:   No    Order Specific Question:   Preferred imaging location?    Answer:   [facility]    Order Specific Question:   Radiology  Contrast Protocol - do NOT remove file path    Answer:   \\[email or web address]   The patient has a good understanding of the overall plan. she agrees with it. she will call with any problems that may develop before the next visit here.   Rulon Eisenmenger, MD 07/11/17

## 2017-07-11 NOTE — Telephone Encounter (Signed)
Spoke with patient and got her mammogram scheduled.

## 2017-07-11 NOTE — Assessment & Plan Note (Signed)
Left Lumpectomy 09/27/16: IDC 2 tumors 1.2 cm and 1 cm, 0/3 LN Neg, Positive posterior margin (Muscle), Er 95%, PR 90% Ki67: 15%, Her 2 Neg, T1bN0 Stage 1A  Oncotype DX recurrence score 18: Risk of recurrence 12% Adjuvant radiation started 11/20/2016 completed 01/07/2017  Recommendation:Adjuvant antiestrogen therapy with Anastrozole 2.5 mg daily for 5-7 years started 01/07/2017  Anastrozole toxicities: Tolerating it very well without any major problems. Monitoring closely for anastrozole toxicities  Surveillance: 1. Breast exam 07/11/2017: Benign 2. mammograms: Will be done at the end of October

## 2017-07-25 IMAGING — MG BREAST SURGICAL SPECIMEN
1 series · 1 of 1 positions shown · non-contrast
Comparison: Previous exam(s).

CLINICAL DATA: Left breast cancer, 2 adjacent sites, status post
lumpectomy today after earlier radioactive seed localizations.

EXAM:
SPECIMEN RADIOGRAPH OF THE LEFT BREAST

[L]
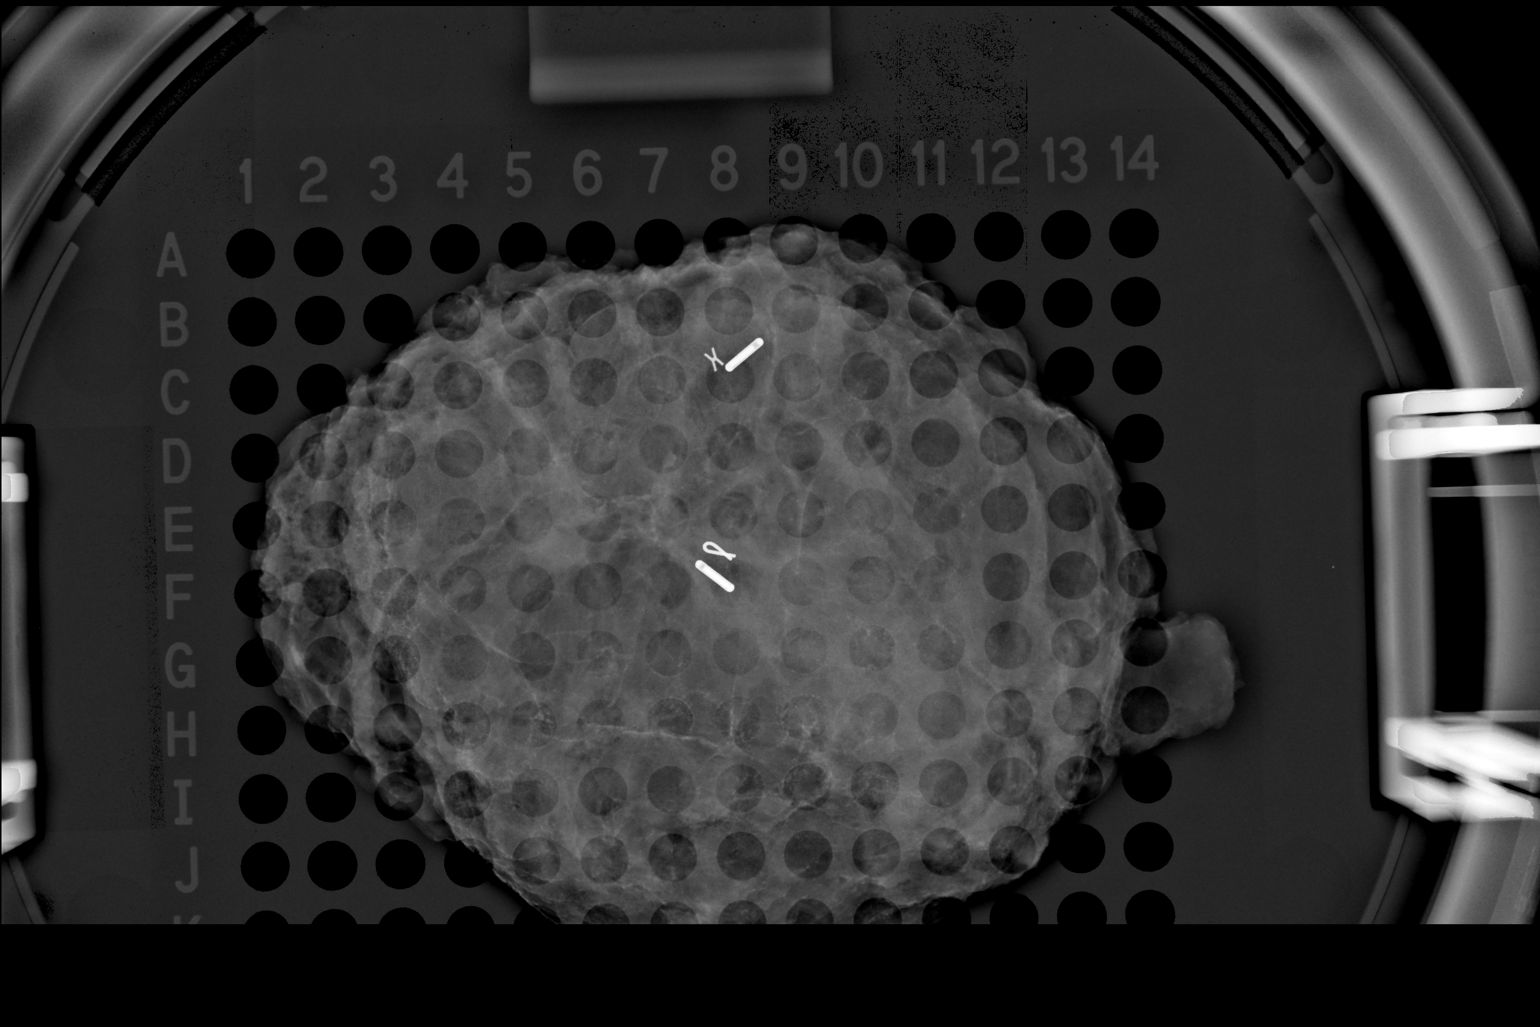

[1 of 1 positions shown; findings below may reference images not displayed]

FINDINGS: Status post excision of the left breast. The 2 radioactive seeds and
biopsy marker clips are present, completely intact, and were marked
for pathology. The sites of the seeds and clips were discussed with
the OR staff during the procedure.
IMPRESSION: Specimen radiograph of the left breast.

## 2017-08-05 ENCOUNTER — Encounter: Payer: Self-pay | Admitting: Gastroenterology

## 2017-08-06 ENCOUNTER — Telehealth: Payer: Self-pay | Admitting: Hematology and Oncology

## 2017-08-06 NOTE — Telephone Encounter (Signed)
Faxed records to [place] [phone]

## 2017-08-08 ENCOUNTER — Telehealth: Payer: Self-pay | Admitting: *Deleted

## 2017-08-08 NOTE — Telephone Encounter (Signed)
"  I need to schedule my CT scans.  Was instructed to call when scans are approved by insurance.  I've worked on getting scans approved and now ready to schedule.  I do not have a pen to write the scheduling number down.  Can you transfer me?"

## 2017-08-13 ENCOUNTER — Encounter (HOSPITAL_COMMUNITY): Payer: Self-pay

## 2017-08-13 ENCOUNTER — Ambulatory Visit (HOSPITAL_COMMUNITY)
Admission: RE | Admit: 2017-08-13 | Discharge: 2017-08-13 | Disposition: A | Discharge status: Home / Self Care | Payer: Managed Care, Other (non HMO) | Source: Ambulatory Visit | Attending: Hematology and Oncology | Admitting: Hematology and Oncology

## 2017-08-13 DIAGNOSIS — K8020 Calculus of gallbladder without cholecystitis without obstruction: Secondary | ICD-10-CM | POA: Insufficient documentation

## 2017-08-13 DIAGNOSIS — R1031 Right lower quadrant pain: Secondary | ICD-10-CM | POA: Insufficient documentation

## 2017-08-13 DIAGNOSIS — Z9889 Other specified postprocedural states: Secondary | ICD-10-CM | POA: Diagnosis not present

## 2017-08-13 DIAGNOSIS — R0602 Shortness of breath: Secondary | ICD-10-CM | POA: Insufficient documentation

## 2017-08-13 MED ORDER — IOPAMIDOL (ISOVUE-300) INJECTION 61%
INTRAVENOUS | Status: AC
  Administered 2017-08-13: 100 mL
  Filled 2017-08-13: qty 100

## 2017-08-14 ENCOUNTER — Telehealth: Payer: Self-pay

## 2017-08-14 NOTE — Telephone Encounter (Signed)
Called pt to let her know of her CT Results. After Dr.Gudena reviewed CT pt has no new cancer or metastatic disease found. Pt does have some cholelithiasis that may need some follow up with her primary care. Pt does not report having any symptoms of cholelithiasis at this time. Will fax results to primary MD for further follow up care. Pt verbalized understanding and had great sense of relief to know that her cancer is not back. Appreciative of call and has no further concerns at this time.

## 2017-08-26 ENCOUNTER — Ambulatory Visit
Admission: RE | Admit: 2017-08-26 | Discharge: 2017-08-26 | Disposition: A | Discharge status: Home / Self Care | Payer: Managed Care, Other (non HMO) | Source: Ambulatory Visit | Attending: Hematology and Oncology | Admitting: Hematology and Oncology

## 2017-08-26 DIAGNOSIS — Z170 Estrogen receptor positive status [ER+]: Principal | ICD-10-CM

## 2017-08-26 DIAGNOSIS — C50212 Malignant neoplasm of upper-inner quadrant of left female breast: Secondary | ICD-10-CM

## 2017-08-26 HISTORY — DX: Malignant neoplasm of unspecified site of unspecified female breast: C50.919

## 2017-08-26 HISTORY — DX: Personal history of irradiation: Z92.3

## 2017-09-23 ENCOUNTER — Ambulatory Visit (AMBULATORY_SURGERY_CENTER): Payer: Self-pay | Admitting: *Deleted

## 2017-09-23 ENCOUNTER — Other Ambulatory Visit: Payer: Self-pay

## 2017-09-23 VITALS — Ht 64.0 in | Wt 170.0 lb

## 2017-09-23 DIAGNOSIS — Z86010 Personal history of colonic polyps: Secondary | ICD-10-CM

## 2017-09-23 DIAGNOSIS — E663 Overweight: Secondary | ICD-10-CM | POA: Insufficient documentation

## 2017-09-23 MED ORDER — NA SULFATE-K SULFATE-MG SULF 17.5-3.13-1.6 GM/177ML PO SOLN: ORAL | 0 refills | Status: DC

## 2017-09-23 NOTE — Progress Notes (Signed)
Patient denies any allergies to eggs or soy. Patient denies any problems with sedation. Patient states "hard to wake up from anesthesia".  Patient denies any oxygen use at home. Patient denies taking any diet/weight loss medications or blood thinners. EMMI education assisgned to patient on colonoscopy, this was explained and instructions given to patient. Patient came into pv upset because she "may have got fired over this appointment". Pt did not want me to go over prep instructions because she has to get back to work. I did explain how to drink the suprep. Patient states no changes in medical hx since her last ov. Patient states she is not taking any medications except the blood pressure pill.

## 2017-09-25 ENCOUNTER — Encounter: Payer: Self-pay | Admitting: Gastroenterology

## 2017-10-07 ENCOUNTER — Encounter: Payer: Managed Care, Other (non HMO) | Admitting: Gastroenterology

## 2017-10-09 ENCOUNTER — Encounter: Payer: Self-pay | Admitting: Gastroenterology

## 2017-10-09 ENCOUNTER — Ambulatory Visit (AMBULATORY_SURGERY_CENTER): Payer: Managed Care, Other (non HMO) | Admitting: Gastroenterology

## 2017-10-09 ENCOUNTER — Other Ambulatory Visit: Payer: Self-pay

## 2017-10-09 VITALS — BP 149/86 | HR 67 | Temp 98.6°F | Resp 13 | Ht 64.0 in | Wt 170.0 lb

## 2017-10-09 DIAGNOSIS — D122 Benign neoplasm of ascending colon: Secondary | ICD-10-CM

## 2017-10-09 DIAGNOSIS — Z86010 Personal history of colonic polyps: Secondary | ICD-10-CM | POA: Diagnosis not present

## 2017-10-09 MED ORDER — SODIUM CHLORIDE 0.9 % IV SOLN: 500.0000 mL | INTRAVENOUS | Status: DC

## 2017-10-09 NOTE — Op Note (Signed)
[facility] Patient Name: Bonnie Marquez Procedure Date: 10/09/2017 10:39 AM MRN: [id number] Endoscopist: Ladene Artist , MD Age: 56 Referring MD:  Date of Birth: 1961-08-18 Gender: Female Account #: [id number] Procedure:                Colonoscopy Indications:              Surveillance: Personal history of adenomatous                            polyps on last colonoscopy 5 years ago Medicines:                Monitored Anesthesia Care Procedure:                Pre-Anesthesia Assessment:                           - Prior to the procedure, a History and Physical                            was performed, and patient medications and                            allergies were reviewed. The patient's tolerance of                            previous anesthesia was also reviewed. The risks                            and benefits of the procedure and the sedation                            options and risks were discussed with the patient.                            All questions were answered, and informed consent                            was obtained. Prior Anticoagulants: The patient has                            taken no previous anticoagulant or antiplatelet                            agents. ASA Grade Assessment: II - A patient with                            mild systemic disease. After reviewing the risks                            and benefits, the patient was deemed in                            satisfactory condition to undergo the procedure.  After obtaining informed consent, the colonoscope                            was passed under direct vision. Throughout the                            procedure, the patient's blood pressure, pulse, and                            oxygen saturations were monitored continuously. The                            Colonoscope was introduced through the anus and                            advanced to the the  cecum, identified by                            appendiceal orifice and ileocecal valve. The                            ileocecal valve, appendiceal orifice, and rectum                            were photographed. The quality of the bowel                            preparation was adequate. The colonoscopy was                            performed without difficulty. The patient tolerated                            the procedure well. Scope In: 10:49:33 AM Scope Out: 11:02:53 AM Scope Withdrawal Time: 0 hours 11 minutes 18 seconds  Total Procedure Duration: 0 hours 13 minutes 20 seconds  Findings:                 The perianal and digital rectal examinations were                            normal.                           A 8 mm polyp was found in the ascending colon. The                            polyp was sessile. The polyp was removed with a                            cold snare. Resection and retrieval were complete.                           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                           The exam was otherwise without abnormality on                            direct and retroflexion views. Complications:            No immediate complications. Estimated blood loss:                            None. Estimated Blood Loss:     Estimated blood loss: none. Impression:               - One 8 mm polyp in the ascending colon, removed                            with a cold snare. Resected and retrieved.                           - Melanosis in the colon.                           - The examination was otherwise normal on direct                            and retroflexion views. Recommendation:           - Repeat colonoscopy in 5 years for surveillance.                           - Patient has a contact number available for                            emergencies. The signs and symptoms of potential                             delayed complications were discussed with the                            patient. Return to normal activities tomorrow.                            Written discharge instructions were provided to the                            patient.                           - Resume previous diet.                           - Continue present medications.                           - Await pathology results. Ladene Artist, MD 10/09/2017 11:05:43 AM This report has been signed electronically.

## 2017-10-09 NOTE — Progress Notes (Signed)
Report to PACU, RN, vss, BBS= Clear.  

## 2017-10-09 NOTE — Patient Instructions (Addendum)
Colon polyp x 1 removed today. Handout given on polyps. Result letter in your mail in 2-3 weeks.  Repeat colonoscopy in 5 years. Resume current medications.  Call us with any questions or concerns. Thank you!!  YOU HAD AN ENDOSCOPIC PROCEDURE TODAY AT [facility]:   Refer to the procedure report that was given to you for any specific questions about what was found during the examination.  If the procedure report does not answer your questions, please call your gastroenterologist to clarify.  If you requested that your care partner not be given the details of your procedure findings, then the procedure report has been included in a sealed envelope for you to review at your convenience later.  YOU SHOULD EXPECT: Some feelings of bloating in the abdomen. Passage of more gas than usual.  Walking can help get rid of the air that was put into your GI tract during the procedure and reduce the bloating. If you had a lower endoscopy (such as a colonoscopy or flexible sigmoidoscopy) you may notice spotting of blood in your stool or on the toilet paper. If you underwent a bowel prep for your procedure, you may not have a normal bowel movement for a few days.  Please Note:  You might notice some irritation and congestion in your nose or some drainage.  This is from the oxygen used during your procedure.  There is no need for concern and it should clear up in a day or so.  SYMPTOMS TO REPORT IMMEDIATELY:   Following lower endoscopy (colonoscopy or flexible sigmoidoscopy):  Excessive amounts of blood in the stool  Significant tenderness or worsening of abdominal pains  Swelling of the abdomen that is new, acute  Fever of 100F or higher  For urgent or emergent issues, a gastroenterologist can be reached at any hour by calling [phone].   DIET:  We do recommend a small meal at first, but then you may proceed to your regular diet.  Drink plenty of fluids but you should avoid alcoholic  beverages for 24 hours.  ACTIVITY:  You should plan to take it easy for the rest of today and you should NOT DRIVE or use heavy machinery until tomorrow (because of the sedation medicines used during the test).    FOLLOW UP: Our staff will call the number listed on your records the next business day following your procedure to check on you and address any questions or concerns that you may have regarding the information given to you following your procedure. If we do not reach you, we will leave a message.  However, if you are feeling well and you are not experiencing any problems, there is no need to return our call.  We will assume that you have returned to your regular daily activities without incident.  If any biopsies were taken you will be contacted by phone or by letter within the next 1-3 weeks.  Please call us at [phone] if you have not heard about the biopsies in 3 weeks.    SIGNATURES/CONFIDENTIALITY: You and/or your care partner have signed paperwork which will be entered into your electronic medical record.  These signatures attest to the fact that that the information above on your After Visit Summary has been reviewed and is understood.  Full responsibility of the confidentiality of this discharge information lies with you and/or your care-partner.

## 2017-10-09 NOTE — Progress Notes (Signed)
Called to room to assist during endoscopic procedure.  Patient ID and intended procedure confirmed with present staff. Received instructions for my participation in the procedure from the performing physician.  

## 2017-10-09 NOTE — Progress Notes (Signed)
Pt's states no medical or surgical changes since previsit or office visit. 

## 2017-10-10 ENCOUNTER — Telehealth: Payer: Self-pay | Admitting: *Deleted

## 2017-10-10 NOTE — Telephone Encounter (Signed)
  Follow up Call-  Call back number 10/09/2017  Post procedure Call Back phone  # [phone]  Permission to leave phone message Yes  Some recent data might be hidden     Patient questions:  Do you have a fever, pain , or abdominal swelling? No. Pain Score  0 *  Have you tolerated food without any problems? Yes.    Have you been able to return to your normal activities? Yes.    Do you have any questions about your discharge instructions: Diet   No. Medications  No. Follow up visit  No.  Do you have questions or concerns about your Care? No.  Actions: * If pain score is 4 or above: No action needed, pain <4.

## 2017-10-23 ENCOUNTER — Telehealth: Payer: Self-pay | Admitting: Gastroenterology

## 2017-10-23 NOTE — Telephone Encounter (Signed)
The pt advised that Dr Fuller Plan has not reviewed the path and she will be notified as soon as reviewed.

## 2017-10-25 ENCOUNTER — Encounter: Payer: Self-pay | Admitting: Gastroenterology

## 2017-11-19 ENCOUNTER — Telehealth: Payer: Self-pay | Admitting: Hematology and Oncology

## 2017-11-19 NOTE — Telephone Encounter (Signed)
Patient message received and F/U appointment was made with patient directly

## 2017-11-28 ENCOUNTER — Encounter: Payer: Managed Care, Other (non HMO) | Admitting: Gastroenterology

## 2017-12-11 ENCOUNTER — Encounter: Payer: Self-pay | Admitting: Hematology and Oncology

## 2017-12-19 ENCOUNTER — Inpatient Hospital Stay: Payer: Managed Care, Other (non HMO) | Attending: Hematology and Oncology | Admitting: Hematology and Oncology

## 2017-12-19 ENCOUNTER — Ambulatory Visit: Payer: Managed Care, Other (non HMO) | Admitting: Hematology and Oncology

## 2017-12-19 DIAGNOSIS — Z79811 Long term (current) use of aromatase inhibitors: Secondary | ICD-10-CM

## 2017-12-19 DIAGNOSIS — Z170 Estrogen receptor positive status [ER+]: Secondary | ICD-10-CM | POA: Insufficient documentation

## 2017-12-19 DIAGNOSIS — C50212 Malignant neoplasm of upper-inner quadrant of left female breast: Secondary | ICD-10-CM | POA: Diagnosis not present

## 2017-12-19 DIAGNOSIS — Z923 Personal history of irradiation: Secondary | ICD-10-CM | POA: Diagnosis not present

## 2017-12-19 MED ORDER — ANASTROZOLE 1 MG PO TABS: 1.0000 mg | ORAL_TABLET | Freq: Every day | ORAL | 3 refills | Status: DC

## 2017-12-19 NOTE — Progress Notes (Signed)
Patient Care Team: Vania Rea, MD as PCP - General (Obstetrics and Gynecology)  DIAGNOSIS:  Encounter Diagnosis  Name Primary?  . Malignant neoplasm of upper-inner quadrant of left breast in female, estrogen receptor positive ([place])     SUMMARY OF ONCOLOGIC HISTORY:   Malignant neoplasm of upper-inner quadrant of left breast in female, estrogen receptor positive ([place])   09/13/2016 Initial Diagnosis    Left breast biopsy UIQ: Grade 1 invasive ductal carcinoma ER 95%, PR 90%, Ki-67 15%, HER-2 negative ratio 1.41; screening detected left breast mass at 11:30 position 6 mm size, axilla negative, T1 BN 0 stage IA clinical stage      09/27/2016 Surgery    Left Lumpectomy: IDC 2 tumors 1.2 cm and 1 cm, 0/3 LN Neg, Positive posterior margin (Muscle),  Er 95%, PR 90% Ki67: 15%, Her 2 Neg, T1bN0 Stage 1A      10/08/2016 Oncotype testing    Oncotype DX score 18: Risk of recurrence with tamoxifen for 5 years at 12%, low risk      11/20/2016 - 01/07/2017 Radiation Therapy    Adjuvant radiation therapy      01/09/2017 -  Anti-estrogen oral therapy    Anastrozole 1 mg by mouth daily       CHIEF COMPLIANT: Follow-up on anastrozole therapy  INTERVAL HISTORY: Bonnie Marquez is a 57 year old with above-mentioned history of left lumpectomy for stage I breast cancer who underwent radiation and is currently on antiestrogen therapy.  Initially she could not tolerate anastrozole because of muscle aches and pains.  Later I prescribed her letrozole but she read the side effects of letrozole and decided to stick with anastrozole.  Over time her aches and pains have improved.  She had a CT chest which showed gallstones.  There was no evidence of malignancy.   REVIEW OF SYSTEMS:   Constitutional: Denies fevers, chills or abnormal weight loss Eyes: Denies blurriness of vision Ears, nose, mouth, throat, and face: Denies mucositis or sore throat Respiratory: Denies cough, dyspnea or  wheezes Cardiovascular: Denies palpitation, chest discomfort Gastrointestinal:  Denies nausea, heartburn or change in bowel habits Skin: Denies abnormal skin rashes Lymphatics: Denies new lymphadenopathy or easy bruising Neurological:Denies numbness, tingling or new weaknesses Behavioral/Psych: Mood is stable, no new changes  Extremities: No lower extremity edema All other systems were reviewed with the patient and are negative.  I have reviewed the past medical history, past surgical history, social history and family history with the patient and they are unchanged from previous note.  ALLERGIES:  is allergic to codeine.  MEDICATIONS:  Current Outpatient Medications  Medication Sig Dispense Refill  . anastrozole (ARIMIDEX) 1 MG tablet Take 1 tablet (1 mg total) by mouth daily. 90 tablet 3  . triamterene-hydrochlorothiazide (DYAZIDE) 37.5-25 MG capsule Take 1 capsule by mouth daily.     No current facility-administered medications for this visit.     PHYSICAL EXAMINATION: ECOG PERFORMANCE STATUS: 1 - Symptomatic but completely ambulatory  Vitals:   12/19/17 1439  BP: (!) 151/94  Pulse: 91  Resp: 18  Temp: 98.5 F (36.9 C)  SpO2: 99%   Filed Weights    GENERAL:alert, no distress and comfortable SKIN: skin color, texture, turgor are normal, no rashes or significant lesions EYES: normal, Conjunctiva are pink and non-injected, sclera clear OROPHARYNX:no exudate, no erythema and lips, buccal mucosa, and tongue normal  NECK: supple, thyroid normal size, non-tender, without nodularity LYMPH:  no palpable lymphadenopathy in the cervical, axillary or inguinal LUNGS: clear to  auscultation and percussion with normal breathing effort HEART: regular rate & rhythm and no murmurs and no lower extremity edema ABDOMEN:abdomen soft, non-tender and normal bowel sounds MUSCULOSKELETAL:no cyanosis of digits and no clubbing  NEURO: alert & oriented x 3 with fluent speech, no focal  motor/sensory deficits EXTREMITIES: No lower extremity edema BREAST: No palpable masses or nodules in either right or left breasts. No palpable axillary supraclavicular or infraclavicular adenopathy no breast tenderness or nipple discharge. (exam performed in the presence of a chaperone)  LABORATORY DATA:  I have reviewed the data as listed CMP Latest Ref Rng & Units 09/19/2016  Glucose 70 - 140 mg/dl 92  BUN 7.0 - 26.0 mg/dL 15.2  Creatinine 0.6 - 1.1 mg/dL 0.8  Sodium 136 - 145 mEq/L 138  Potassium 3.5 - 5.1 mEq/L 3.9  CO2 22 - 29 mEq/L 27  Calcium 8.4 - 10.4 mg/dL 10.6(H)  Total Protein 6.4 - 8.3 g/dL 7.7  Total Bilirubin 0.20 - 1.20 mg/dL 1.28(H)  Alkaline Phos 40 - 150 U/L 78  AST 5 - 34 U/L 50(H)  ALT 0 - 55 U/L 57(H)    Lab Results  Component Value Date   WBC 5.5 09/19/2016   HGB 14.4 09/19/2016   HCT 42.7 09/19/2016   MCV 94.5 09/19/2016   PLT 254 09/19/2016   NEUTROABS 3.2 09/19/2016    ASSESSMENT & PLAN:  Malignant neoplasm of upper-inner quadrant of left breast in female, estrogen receptor positive ([place]) Left Lumpectomy 09/27/16: IDC 2 tumors 1.2 cm and 1 cm, 0/3 LN Neg, Positive posterior margin (Muscle), Er 95%, PR 90% Ki67: 15%, Her 2 Neg, T1bN0 Stage 1A  Oncotype DX recurrence score 18: Risk of recurrence 12% Adjuvant radiation started 11/20/2016 completed 01/07/2017  Current treatment:Adjuvant antiestrogen therapy with Anastrozole 2.5 mg daily for 5-7 years started 01/07/2017   Anastrozole toxicities: Mild to moderate arthralgias and myalgias.  Surveillance: 1. Breast exam 12/19/2017: Benign 2. mammograms  08/26/2017: Breast density category C, no mammographic evidence of malignancy 3.  CT chest abdomen pelvis 08/13/2017: No evidence of malignancy  I discussed with her about decreasing the alcohol intake as well as pursuing exercise and diet.  Return to clinic in 1 year for follow-up  I spent 25 minutes talking to the patient of which more than  half was spent in counseling and coordination of care.  No orders of the defined types were placed in this encounter.  The patient has a good understanding of the overall plan. she agrees with it. she will call with any problems that may develop before the next visit here.   Harriette Ohara, MD 12/19/17

## 2017-12-19 NOTE — Assessment & Plan Note (Signed)
Left Lumpectomy 09/27/16: IDC 2 tumors 1.2 cm and 1 cm, 0/3 LN Neg, Positive posterior margin (Muscle), Er 95%, PR 90% Ki67: 15%, Her 2 Neg, T1bN0 Stage 1A  Oncotype DX recurrence score 18: Risk of recurrence 12% Adjuvant radiation started 11/20/2016 completed 01/07/2017  Current treatment:Adjuvant antiestrogen therapy with Anastrozole 2.5 mg daily for 5-7 years started 01/07/2017 Switched to letrozole 07/11/2017 due to musculoskeletal aches and pains  Surveillance: 1. Breast exam 12/19/2017: Benign 2. mammograms 08/26/2017: Breast density category C, no mammographic evidence of malignancy 3.  CT chest abdomen pelvis 08/13/2017: No evidence of malignancy  I discussed with her about decreasing the alcohol intake as well as pursuing exercise and diet.  Return to clinic in 1 year for follow-up

## 2018-04-24 ENCOUNTER — Other Ambulatory Visit: Payer: Self-pay | Admitting: General Surgery

## 2018-04-25 ENCOUNTER — Telehealth: Payer: Self-pay

## 2018-04-25 NOTE — Telephone Encounter (Signed)
Received call from patient regarding she is having some skin changes in her right breast not the left where she had her breast cancer.  Returned pt's call and she reports a red circle-like area little less than dime-size on her skin at the lower part of right breast near the nipple.  Denies pain or a lump. Noticed it about 2 days ago.  Notified Dr Lindi Adie and he would like her to have office appt with him or Mendel Ryder NP on this Monday.  Mendel Ryder NP not in office that day, scheduled pt with Dr Lindi Adie for Monday, July 1st at 2:45 pm, arrival at 2:30 am- pt agreeable. No other needs per pt at this time.

## 2018-04-28 ENCOUNTER — Inpatient Hospital Stay: Payer: Managed Care, Other (non HMO) | Attending: Hematology and Oncology | Admitting: Hematology and Oncology

## 2018-04-28 DIAGNOSIS — C50212 Malignant neoplasm of upper-inner quadrant of left female breast: Secondary | ICD-10-CM

## 2018-04-28 DIAGNOSIS — Z79811 Long term (current) use of aromatase inhibitors: Secondary | ICD-10-CM | POA: Insufficient documentation

## 2018-04-28 DIAGNOSIS — N6459 Other signs and symptoms in breast: Secondary | ICD-10-CM | POA: Diagnosis not present

## 2018-04-28 DIAGNOSIS — Z170 Estrogen receptor positive status [ER+]: Secondary | ICD-10-CM | POA: Diagnosis not present

## 2018-04-28 DIAGNOSIS — Z923 Personal history of irradiation: Secondary | ICD-10-CM | POA: Insufficient documentation

## 2018-04-28 NOTE — Progress Notes (Signed)
Patient Care Team: Vania Rea, MD as PCP - General (Obstetrics and Gynecology)  DIAGNOSIS:  Encounter Diagnosis  Name Primary?  . Malignant neoplasm of upper-inner quadrant of left breast in female, estrogen receptor positive ([place])     SUMMARY OF ONCOLOGIC HISTORY:   Malignant neoplasm of upper-inner quadrant of left breast in female, estrogen receptor positive ([place])   09/13/2016 Initial Diagnosis    Left breast biopsy UIQ: Grade 1 invasive ductal carcinoma ER 95%, PR 90%, Ki-67 15%, HER-2 negative ratio 1.41; screening detected left breast mass at 11:30 position 6 mm size, axilla negative, T1 BN 0 stage IA clinical stage      09/27/2016 Surgery    Left Lumpectomy: IDC 2 tumors 1.2 cm and 1 cm, 0/3 LN Neg, Positive posterior margin (Muscle),  Er 95%, PR 90% Ki67: 15%, Her 2 Neg, T1bN0 Stage 1A      10/08/2016 Oncotype testing    Oncotype DX score 18: Risk of recurrence with tamoxifen for 5 years at 12%, low risk      11/20/2016 - 01/07/2017 Radiation Therapy    Adjuvant radiation therapy      01/09/2017 -  Anti-estrogen oral therapy    Anastrozole 1 mg by mouth daily        CHIEF COMPLIANT: Follow-up o urgent visit regarding skin change in the right breast she came in urgently because over the small area of the skin which looked  INTERVAL HISTORY: Bonnie Marquez is a 57 year old with above-mentioned history of present treated with lumpectomy and radiation is current on anastrozole therapy.  She appears to be tolerating anastrozole extremely well.  She does not have any hot flashes or myalgias. Unusual and she wanted to be checked out.  Her sister informed her that it could be skin cancer and wanted to to be evaluated.  It does not cause her any pain or discomfort.  It started off having a scab and once a scab went away letter to a oval-shaped dark discoloration measuring up to a centimeter in length.  REVIEW OF SYSTEMS:   Constitutional: Denies fevers, chills or abnormal  weight loss Eyes: Denies blurriness of vision Ears, nose, mouth, throat, and face: Denies mucositis or sore throat Respiratory: Denies cough, dyspnea or wheezes Cardiovascular: Denies palpitation, chest discomfort Gastrointestinal:  Denies nausea, heartburn or change in bowel habits Skin: Denies abnormal skin rashes Lymphatics: Denies new lymphadenopathy or easy bruising Neurological:Denies numbness, tingling or new weaknesses Behavioral/Psych: Mood is stable, no new changes  Extremities: No lower extremity edema Breast: 1 cm area of darkening of the skin in an oval fashion at 4 o'clock position right breast All other systems were reviewed with the patient and are negative.  I have reviewed the past medical history, past surgical history, social history and family history with the patient and they are unchanged from previous note.  ALLERGIES:  is allergic to codeine.  MEDICATIONS:  Current Outpatient Medications  Medication Sig Dispense Refill  . anastrozole (ARIMIDEX) 1 MG tablet Take 1 tablet (1 mg total) by mouth daily. 90 tablet 3  . triamterene-hydrochlorothiazide (DYAZIDE) 37.5-25 MG capsule Take 1 capsule by mouth daily.     No current facility-administered medications for this visit.     PHYSICAL EXAMINATION: ECOG PERFORMANCE STATUS: 0 - Asymptomatic  Vitals:   04/28/18 1442  BP: (!) 155/106  Pulse: 80  Resp: 20  Temp: 98.5 F (36.9 C)  SpO2: 100%   There were no vitals filed for this visit.  GENERAL:alert, no  distress and comfortable SKIN: skin color, texture, turgor are normal, no rashes or significant lesions EYES: normal, Conjunctiva are pink and non-injected, sclera clear OROPHARYNX:no exudate, no erythema and lips, buccal mucosa, and tongue normal  NECK: supple, thyroid normal size, non-tender, without nodularity LYMPH:  no palpable lymphadenopathy in the cervical, axillary or inguinal LUNGS: clear to auscultation and percussion with normal breathing  effort HEART: regular rate & rhythm and no murmurs and no lower extremity edema ABDOMEN:abdomen soft, non-tender and normal bowel sounds MUSCULOSKELETAL:no cyanosis of digits and no clubbing  NEURO: alert & oriented x 3 with fluent speech, no focal motor/sensory deficits EXTREMITIES: No lower extremity edema BREAST: No palpable masses or nodules in either right or left breasts. No palpable axillary supraclavicular or infraclavicular adenopathy no breast tenderness or nipple discharge. (exam performed in the presence of a chaperone)  LABORATORY DATA:  I have reviewed the data as listed CMP Latest Ref Rng & Units 09/19/2016  Glucose 70 - 140 mg/dl 92  BUN 7.0 - 26.0 mg/dL 15.2  Creatinine 0.6 - 1.1 mg/dL 0.8  Sodium 136 - 145 mEq/L 138  Potassium 3.5 - 5.1 mEq/L 3.9  CO2 22 - 29 mEq/L 27  Calcium 8.4 - 10.4 mg/dL 10.6(H)  Total Protein 6.4 - 8.3 g/dL 7.7  Total Bilirubin 0.20 - 1.20 mg/dL 1.28(H)  Alkaline Phos 40 - 150 U/L 78  AST 5 - 34 U/L 50(H)  ALT 0 - 55 U/L 57(H)    Lab Results  Component Value Date   WBC 5.5 09/19/2016   HGB 14.4 09/19/2016   HCT 42.7 09/19/2016   MCV 94.5 09/19/2016   PLT 254 09/19/2016   NEUTROABS 3.2 09/19/2016    ASSESSMENT & PLAN:  Malignant neoplasm of upper-inner quadrant of left breast in female, estrogen receptor positive ([place]) Left Lumpectomy 09/27/16: IDC 2 tumors 1.2 cm and 1 cm, 0/3 LN Neg, Positive posterior margin (Muscle), Er 95%, PR 90% Ki67: 15%, Her 2 Neg, T1bN0 Stage 1A  Oncotype DX recurrence score 18: Risk of recurrence 12% Adjuvant radiation started 11/20/2016 completed 01/07/2017  Current treatment:Adjuvant antiestrogen therapy with Anastrozole 2.5 mg daily for 5-7 years started 01/07/2017   Anastrozole toxicities: Mild to moderate arthralgias and myalgias.  Right breast skin changes: There is a small area of discoloration of the skin on the right breast.  It is around the nipple at 4 o'clock position.  He does not  have any raised edges or irregular margins.  It could be resolved infection of the skin.  There is no palpable nodularity around it.  I did not recommend surgical resection of this area of the skin.  I instructed her to watch it and call us if it starts to increase in size or develops raised edges or unusual discolorations I reassured her that its probably a benign skin change  Surveillance: 1.Breast exam  04/28/2018: Benign 2.mammograms  08/26/2017: Breast density category C, no mammographic evidence of malignancy 3.  CT chest abdomen pelvis 08/13/2017: No evidence of malignancy  Return to clinic in 1 year for follow-up   No orders of the defined types were placed in this encounter.  The patient has a good understanding of the overall plan. she agrees with it. she will call with any problems that may develop before the next visit here.   Harriette Ohara, MD 04/28/18

## 2018-04-28 NOTE — Assessment & Plan Note (Signed)
Left Lumpectomy 09/27/16: IDC 2 tumors 1.2 cm and 1 cm, 0/3 LN Neg, Positive posterior margin (Muscle), Er 95%, PR 90% Ki67: 15%, Her 2 Neg, T1bN0 Stage 1A  Oncotype DX recurrence score 18: Risk of recurrence 12% Adjuvant radiation started 11/20/2016 completed 01/07/2017  Current treatment:Adjuvant antiestrogen therapy with Anastrozole 2.5 mg daily for 5-7 years started 01/07/2017   Anastrozole toxicities: Mild to moderate arthralgias and myalgias.  Surveillance: 1.Breast exam  04/28/2018: Benign 2.mammograms  08/26/2017: Breast density category C, no mammographic evidence of malignancy 3.  CT chest abdomen pelvis 08/13/2017: No evidence of malignancy  Return to clinic in 1 year for follow-up

## 2018-05-18 ENCOUNTER — Other Ambulatory Visit: Payer: Self-pay | Admitting: Hematology and Oncology

## 2018-05-28 ENCOUNTER — Other Ambulatory Visit: Payer: Self-pay

## 2018-05-28 MED ORDER — ANASTROZOLE 1 MG PO TABS: 1.0000 mg | ORAL_TABLET | Freq: Every day | ORAL | 3 refills | Status: DC

## 2018-06-25 ENCOUNTER — Telehealth: Payer: Self-pay

## 2018-06-25 NOTE — Telephone Encounter (Signed)
Returned patient's call.  Patient reports finding a new lump on left breast.  Per patient small amount of swelling, with pain.  Denies any open areas, or redness.  Nurse scheduled patient to have MD evaluate.  Patient voiced understanding and agreement.

## 2018-06-26 ENCOUNTER — Ambulatory Visit
Admission: RE | Admit: 2018-06-26 | Discharge: 2018-06-26 | Disposition: A | Discharge status: Home / Self Care | Payer: Managed Care, Other (non HMO) | Source: Ambulatory Visit | Attending: Hematology and Oncology | Admitting: Hematology and Oncology

## 2018-06-26 ENCOUNTER — Inpatient Hospital Stay: Payer: Managed Care, Other (non HMO) | Attending: Hematology and Oncology | Admitting: Hematology and Oncology

## 2018-06-26 ENCOUNTER — Other Ambulatory Visit: Payer: Self-pay | Admitting: Hematology and Oncology

## 2018-06-26 DIAGNOSIS — Z170 Estrogen receptor positive status [ER+]: Principal | ICD-10-CM

## 2018-06-26 DIAGNOSIS — C50212 Malignant neoplasm of upper-inner quadrant of left female breast: Secondary | ICD-10-CM

## 2018-06-26 DIAGNOSIS — Z923 Personal history of irradiation: Secondary | ICD-10-CM

## 2018-06-26 DIAGNOSIS — N6489 Other specified disorders of breast: Secondary | ICD-10-CM

## 2018-06-26 DIAGNOSIS — Z79811 Long term (current) use of aromatase inhibitors: Secondary | ICD-10-CM | POA: Insufficient documentation

## 2018-06-26 DIAGNOSIS — C50512 Malignant neoplasm of lower-outer quadrant of left female breast: Secondary | ICD-10-CM | POA: Diagnosis present

## 2018-06-26 NOTE — Assessment & Plan Note (Signed)
Left Lumpectomy 09/27/16: IDC 2 tumors 1.2 cm and 1 cm, 0/3 LN Neg, Positive posterior margin (Muscle), Er 95%, PR 90% Ki67: 15%, Her 2 Neg, T1bN0 Stage 1A  Oncotype DX recurrence score 18: Risk of recurrence 12% Adjuvant radiation started 11/20/2016 completed 01/07/2017  Current treatment:Adjuvant antiestrogen therapy with Anastrozole 2.5 mg daily for 5-7 years started 01/07/2017   Anastrozole toxicities: Mild to moderate arthralgias and myalgias.  Newly palpated lump in the breast: We will schedule her for a mammogram and ultrasound. Follow-up plan depending on what radiology findings.  Return to clinic as scheduled previously.

## 2018-06-26 NOTE — Progress Notes (Signed)
Patient Care Team: Vania Rea, MD as PCP - General (Obstetrics and Gynecology)  DIAGNOSIS:  Encounter Diagnosis  Name Primary?  . Malignant neoplasm of upper-inner quadrant of left breast in female, estrogen receptor positive ([place])     SUMMARY OF ONCOLOGIC HISTORY:   Malignant neoplasm of upper-inner quadrant of left breast in female, estrogen receptor positive ([place])   09/13/2016 Initial Diagnosis    Left breast biopsy UIQ: Grade 1 invasive ductal carcinoma ER 95%, PR 90%, Ki-67 15%, HER-2 negative ratio 1.41; screening detected left breast mass at 11:30 position 6 mm size, axilla negative, T1 BN 0 stage IA clinical stage    09/27/2016 Surgery    Left Lumpectomy: IDC 2 tumors 1.2 cm and 1 cm, 0/3 LN Neg, Positive posterior margin (Muscle),  Er 95%, PR 90% Ki67: 15%, Her 2 Neg, T1bN0 Stage 1A    10/08/2016 Oncotype testing    Oncotype DX score 18: Risk of recurrence with tamoxifen for 5 years at 12%, low risk    11/20/2016 - 01/07/2017 Radiation Therapy    Adjuvant radiation therapy    01/09/2017 -  Anti-estrogen oral therapy    Anastrozole 1 mg by mouth daily      CHIEF COMPLIANT: Follow-up because patient felt nodularity in the left breast  INTERVAL HISTORY: Bonnie Marquez is a 57 year old with above-mentioned history of left breast cancer treated with lumpectomy followed by radiation and is currently on anastrozole therapy.  She came in for an urgent visit because she felt a nodularity in the left breast at 12 o'clock position.  She is also complaining of breast and left axillary pain that radiates down into the left medial aspect of the arm.  It appears to be getting worse lately.  This is the reason why she made a urgent visit to see me.  REVIEW OF SYSTEMS:   Constitutional: Denies fevers, chills or abnormal weight loss Eyes: Denies blurriness of vision Ears, nose, mouth, throat, and face: Denies mucositis or sore throat Respiratory: Denies cough, dyspnea or  wheezes Cardiovascular: Denies palpitation, chest discomfort Gastrointestinal:  Denies nausea, heartburn or change in bowel habits Skin: Denies abnormal skin rashes Lymphatics: Denies new lymphadenopathy or easy bruising Neurological:Denies numbness, tingling or new weaknesses Behavioral/Psych: Mood is stable, no new changes  Extremities: No lower extremity edema Breast: Left breast nodularity as well as left under the arm numbness and pain All other systems were reviewed with the patient and are negative.  I have reviewed the past medical history, past surgical history, social history and family history with the patient and they are unchanged from previous note.  ALLERGIES:  is allergic to codeine.  MEDICATIONS:  Current Outpatient Medications  Medication Sig Dispense Refill  . anastrozole (ARIMIDEX) 1 MG tablet Take 1 tablet (1 mg total) by mouth daily. 90 tablet 3  . triamterene-hydrochlorothiazide (DYAZIDE) 37.5-25 MG capsule Take 1 capsule by mouth daily.     No current facility-administered medications for this visit.     PHYSICAL EXAMINATION: ECOG PERFORMANCE STATUS: 1 - Symptomatic but completely ambulatory  Vitals:   06/26/18 0913  BP: (!) 134/97  Pulse: 87  Resp: 19  Temp: 98.4 F (36.9 C)  SpO2: 99%   Filed Weights    GENERAL:alert, no distress and comfortable SKIN: skin color, texture, turgor are normal, no rashes or significant lesions EYES: normal, Conjunctiva are pink and non-injected, sclera clear OROPHARYNX:no exudate, no erythema and lips, buccal mucosa, and tongue normal  NECK: supple, thyroid normal size, non-tender, without  nodularity LYMPH:  no palpable lymphadenopathy in the cervical, axillary or inguinal LUNGS: clear to auscultation and percussion with normal breathing effort HEART: regular rate & rhythm and no murmurs and no lower extremity edema ABDOMEN:abdomen soft, non-tender and normal bowel sounds MUSCULOSKELETAL:no cyanosis of digits and  no clubbing  NEURO: alert & oriented x 3 with fluent speech, no focal motor/sensory deficits EXTREMITIES: No lower extremity edema BREAST: I examined the left breast and there is a ridge like nodularity in the 12 o'clock position but I do not feel it is concerning for breast cancer recurrence.  She does have tenderness in the left axilla but I do not feel any lumps or nodules.. (exam performed in the presence of a chaperone)  LABORATORY DATA:  I have reviewed the data as listed CMP Latest Ref Rng & Units 09/19/2016  Glucose 70 - 140 mg/dl 92  BUN 7.0 - 26.0 mg/dL 15.2  Creatinine 0.6 - 1.1 mg/dL 0.8  Sodium 136 - 145 mEq/L 138  Potassium 3.5 - 5.1 mEq/L 3.9  CO2 22 - 29 mEq/L 27  Calcium 8.4 - 10.4 mg/dL 10.6(H)  Total Protein 6.4 - 8.3 g/dL 7.7  Total Bilirubin 0.20 - 1.20 mg/dL 1.28(H)  Alkaline Phos 40 - 150 U/L 78  AST 5 - 34 U/L 50(H)  ALT 0 - 55 U/L 57(H)    Lab Results  Component Value Date   WBC 5.5 09/19/2016   HGB 14.4 09/19/2016   HCT 42.7 09/19/2016   MCV 94.5 09/19/2016   PLT 254 09/19/2016   NEUTROABS 3.2 09/19/2016    ASSESSMENT & PLAN:  Malignant neoplasm of upper-inner quadrant of left breast in female, estrogen receptor positive ([place]) Left Lumpectomy 09/27/16: IDC 2 tumors 1.2 cm and 1 cm, 0/3 LN Neg, Positive posterior margin (Muscle), Er 95%, PR 90% Ki67: 15%, Her 2 Neg, T1bN0 Stage 1A  Oncotype DX recurrence score 18: Risk of recurrence 12% Adjuvant radiation started 11/20/2016 completed 01/07/2017  Current treatment:Adjuvant antiestrogen therapy with Anastrozole 2.5 mg daily for 5-7 years started 01/07/2017   Anastrozole toxicities: Mild to moderate arthralgias and myalgias.  Newly palpated lump in the breast: We will schedule her for a mammogram and ultrasound.  Clinically I do not feel that she has any breast cancer recurrence but we will get the mammogram and ultrasound to verify that. She will be due for her bilateral mammograms in  October. Follow-up plan depending on what radiology findings.  Return to clinic in October for follow-up.  No orders of the defined types were placed in this encounter.  The patient has a good understanding of the overall plan. she agrees with it. she will call with any problems that may develop before the next visit here.   Harriette Ohara, MD 06/26/18

## 2018-06-27 ENCOUNTER — Telehealth: Payer: Self-pay | Admitting: Hematology and Oncology

## 2018-06-27 NOTE — Telephone Encounter (Signed)
Spoke to pt regarding upcoming appts. Pt requested appts after her nov mammo. VG aware.

## 2018-09-01 ENCOUNTER — Other Ambulatory Visit: Payer: Self-pay | Admitting: Hematology and Oncology

## 2018-09-01 ENCOUNTER — Ambulatory Visit: Payer: Managed Care, Other (non HMO)

## 2018-09-01 ENCOUNTER — Ambulatory Visit
Admission: RE | Admit: 2018-09-01 | Discharge: 2018-09-01 | Disposition: A | Discharge status: Home / Self Care | Payer: Managed Care, Other (non HMO) | Source: Ambulatory Visit | Attending: Hematology and Oncology | Admitting: Hematology and Oncology

## 2018-09-01 DIAGNOSIS — Z170 Estrogen receptor positive status [ER+]: Principal | ICD-10-CM

## 2018-09-01 DIAGNOSIS — C50212 Malignant neoplasm of upper-inner quadrant of left female breast: Secondary | ICD-10-CM

## 2018-09-01 DIAGNOSIS — N6489 Other specified disorders of breast: Secondary | ICD-10-CM

## 2018-09-02 ENCOUNTER — Inpatient Hospital Stay: Payer: Managed Care, Other (non HMO) | Attending: Hematology and Oncology | Admitting: Hematology and Oncology

## 2018-09-02 VITALS — BP 144/104 | HR 100 | Temp 98.6°F | Resp 18 | Ht 64.0 in

## 2018-09-02 DIAGNOSIS — Z170 Estrogen receptor positive status [ER+]: Secondary | ICD-10-CM | POA: Diagnosis not present

## 2018-09-02 DIAGNOSIS — Z79811 Long term (current) use of aromatase inhibitors: Secondary | ICD-10-CM | POA: Insufficient documentation

## 2018-09-02 DIAGNOSIS — Z79899 Other long term (current) drug therapy: Secondary | ICD-10-CM | POA: Diagnosis not present

## 2018-09-02 DIAGNOSIS — Z923 Personal history of irradiation: Secondary | ICD-10-CM | POA: Diagnosis not present

## 2018-09-02 DIAGNOSIS — C50212 Malignant neoplasm of upper-inner quadrant of left female breast: Secondary | ICD-10-CM | POA: Diagnosis not present

## 2018-09-02 DIAGNOSIS — Z780 Asymptomatic menopausal state: Secondary | ICD-10-CM

## 2018-09-02 NOTE — Progress Notes (Signed)
Patient Care Team: Vania Rea, MD as PCP - General (Obstetrics and Gynecology)  DIAGNOSIS:  Encounter Diagnoses  Name Primary?  . Malignant neoplasm of upper-inner quadrant of left breast in female, estrogen receptor positive ([place])   . Post-menopausal Yes    SUMMARY OF ONCOLOGIC HISTORY:   Malignant neoplasm of upper-inner quadrant of left breast in female, estrogen receptor positive ([place])   09/13/2016 Initial Diagnosis    Left breast biopsy UIQ: Grade 1 invasive ductal carcinoma ER 95%, PR 90%, Ki-67 15%, HER-2 negative ratio 1.41; screening detected left breast mass at 11:30 position 6 mm size, axilla negative, T1 BN 0 stage IA clinical stage    09/27/2016 Surgery    Left Lumpectomy: IDC 2 tumors 1.2 cm and 1 cm, 0/3 LN Neg, Positive posterior margin (Muscle),  Er 95%, PR 90% Ki67: 15%, Her 2 Neg, T1bN0 Stage 1A    10/08/2016 Oncotype testing    Oncotype DX score 18: Risk of recurrence with tamoxifen for 5 years at 12%, low risk    11/20/2016 - 01/07/2017 Radiation Therapy    Adjuvant radiation therapy    01/09/2017 -  Anti-estrogen oral therapy    Anastrozole 1 mg by mouth daily      CHIEF COMPLIANT: Follow-up to discuss the results of mammogram and ultrasound  INTERVAL HISTORY: Bonnie Marquez is a 56 year old with above-mentioned history of left breast cancer treated with lumpectomy radiation and is currently on anastrozole.  She was complaining of extraordinary amount of pain and discomfort in the left breast and axilla.  She had a mammogram and ultrasound and is here today to discuss the results.  The pain has been worsening over the past 1 month.  REVIEW OF SYSTEMS:   Constitutional: Denies fevers, chills or abnormal weight loss Eyes: Denies blurriness of vision Ears, nose, mouth, throat, and face: Denies mucositis or sore throat Respiratory: Denies cough, dyspnea or wheezes Cardiovascular: Denies palpitation, chest discomfort Gastrointestinal:  Denies nausea,  heartburn or change in bowel habits Skin: Denies abnormal skin rashes Lymphatics: Denies new lymphadenopathy or easy bruising Neurological:Denies numbness, tingling or new weaknesses Behavioral/Psych: Mood is stable, no new changes  Extremities: No lower extremity edema  All other systems were reviewed with the patient and are negative.  I have reviewed the past medical history, past surgical history, social history and family history with the patient and they are unchanged from previous note.  ALLERGIES:  is allergic to codeine.  MEDICATIONS:  Current Outpatient Medications  Medication Sig Dispense Refill  . anastrozole (ARIMIDEX) 1 MG tablet Take 1 tablet (1 mg total) by mouth daily. 90 tablet 3  . triamterene-hydrochlorothiazide (DYAZIDE) 37.5-25 MG capsule Take 1 capsule by mouth daily.     No current facility-administered medications for this visit.     PHYSICAL EXAMINATION: ECOG PERFORMANCE STATUS: 1 - Symptomatic but completely ambulatory  Vitals:   09/02/18 1554  BP: (!) 144/104  Pulse: 100  Resp: 18  Temp: 98.6 F (37 C)  SpO2: 100%   There were no vitals filed for this visit.  GENERAL:alert, no distress and comfortable SKIN: skin color, texture, turgor are normal, no rashes or significant lesions EYES: normal, Conjunctiva are pink and non-injected, sclera clear OROPHARYNX:no exudate, no erythema and lips, buccal mucosa, and tongue normal  NECK: supple, thyroid normal size, non-tender, without nodularity LYMPH:  no palpable lymphadenopathy in the cervical, axillary or inguinal LUNGS: clear to auscultation and percussion with normal breathing effort HEART: regular rate & rhythm and no murmurs  and no lower extremity edema ABDOMEN:abdomen soft, non-tender and normal bowel sounds MUSCULOSKELETAL:no cyanosis of digits and no clubbing  NEURO: alert & oriented x 3 with fluent speech, no focal motor/sensory deficits EXTREMITIES: No lower extremity edema   LABORATORY  DATA:  I have reviewed the data as listed CMP Latest Ref Rng & Units 09/19/2016  Glucose 70 - 140 mg/dl 92  BUN 7.0 - 26.0 mg/dL 15.2  Creatinine 0.6 - 1.1 mg/dL 0.8  Sodium 136 - 145 mEq/L 138  Potassium 3.5 - 5.1 mEq/L 3.9  CO2 22 - 29 mEq/L 27  Calcium 8.4 - 10.4 mg/dL 10.6(H)  Total Protein 6.4 - 8.3 g/dL 7.7  Total Bilirubin 0.20 - 1.20 mg/dL 1.28(H)  Alkaline Phos 40 - 150 U/L 78  AST 5 - 34 U/L 50(H)  ALT 0 - 55 U/L 57(H)    Lab Results  Component Value Date   WBC 5.5 09/19/2016   HGB 14.4 09/19/2016   HCT 42.7 09/19/2016   MCV 94.5 09/19/2016   PLT 254 09/19/2016   NEUTROABS 3.2 09/19/2016    ASSESSMENT & PLAN:  Malignant neoplasm of upper-inner quadrant of left breast in female, estrogen receptor positive ([place]) Left Lumpectomy 09/27/16: IDC 2 tumors 1.2 cm and 1 cm, 0/3 LN Neg, Positive posterior margin (Muscle), Er 95%, PR 90% Ki67: 15%, Her 2 Neg, T1bN0 Stage 1A  Oncotype DX recurrence score 18: Risk of recurrence 12% Adjuvant radiation started 11/20/2016 completed 01/07/2017  Current treatment:Adjuvant antiestrogen therapy with Anastrozole 2.5 mg daily for 5-7 years started 01/07/2017   Anastrozole toxicities: Mild to moderate arthralgias and myalgias.  Breast cancer surveillance: 1.  Mammogram and ultrasound 09/01/2018: No evidence of malignancy, stable appearance of asymmetry in the left breast, postlumpectomy changes. Recommendation was to recheck it in 6 months to assess stability of the benign asymmetry. 2. breast examination   Slight palpable nodularity but otherwise benign.  I will order a bone density test to be done in the next month. We will call her with results of the bone density.  Severe pain in the left breast and axilla: Mammogram and ultrasound were benign.  She will have it repeated in 6 months.  I discussed with her that it could be related to anastrozole.  I asked her to stop anastrozole for 2 weeks and call us back.  If her symptoms  resolve then I suspect that anastrozole was causing the breast discomfort.  Then we will switch her from anastrozole to tamoxifen.  If however her symptoms do not improve with stopping anastrozole, then we discussed with her about different conservative measures because she does not like to take medications.  We discussed the pros and cons of CBD oil as well.  Return to clinic in 1 year for follow-up    Orders Placed This Encounter  Procedures  . DG Bone Density    Standing Status:   Future    Standing Expiration Date:   10/07/2019    Order Specific Question:   Reason for exam:    Answer:   Post menopausal    Order Specific Question:   Preferred imaging location?    Answer:   [facility]   The patient has a good understanding of the overall plan. she agrees with it. she will call with any problems that may develop before the next visit here.   Harriette Ohara, MD 09/02/18

## 2018-09-02 NOTE — Assessment & Plan Note (Signed)
Left Lumpectomy 09/27/16: IDC 2 tumors 1.2 cm and 1 cm, 0/3 LN Neg, Positive posterior margin (Muscle), Er 95%, PR 90% Ki67: 15%, Her 2 Neg, T1bN0 Stage 1A  Oncotype DX recurrence score 18: Risk of recurrence 12% Adjuvant radiation started 11/20/2016 completed 01/07/2017  Current treatment:Adjuvant antiestrogen therapy with Anastrozole 2.5 mg daily for 5-7 years started 01/07/2017   Anastrozole toxicities: Mild to moderate arthralgias and myalgias.  Breast cancer surveillance: 1.  Mammogram and ultrasound 09/01/2018: No evidence of malignancy, stable appearance of asymmetry in the left breast, postlumpectomy changes. Recommendation was to recheck it in 6 months to assess stability of the benign asymmetry. 2. breast examination 09/02/2018: Slight palpable nodularity but otherwise benign.  Return to clinic in 1 year for follow-up

## 2018-10-09 ENCOUNTER — Ambulatory Visit
Admission: RE | Admit: 2018-10-09 | Discharge: 2018-10-09 | Disposition: A | Discharge status: Home / Self Care | Payer: Managed Care, Other (non HMO) | Source: Ambulatory Visit | Attending: Hematology and Oncology | Admitting: Hematology and Oncology

## 2018-10-09 DIAGNOSIS — Z780 Asymptomatic menopausal state: Secondary | ICD-10-CM

## 2018-10-10 ENCOUNTER — Other Ambulatory Visit: Payer: Managed Care, Other (non HMO)

## 2018-10-13 ENCOUNTER — Telehealth: Payer: Self-pay | Admitting: Hematology and Oncology

## 2018-10-13 NOTE — Telephone Encounter (Signed)
I informed patient that the bone density showed a T score of -2 osteopenia Encouraged her to take calcium and vitamin D over-the-counter twice a day along with weightbearing exercises. Repeat bone density in 2 years.

## 2018-12-18 ENCOUNTER — Other Ambulatory Visit: Payer: Self-pay | Admitting: General Surgery

## 2018-12-18 DIAGNOSIS — N6320 Unspecified lump in the left breast, unspecified quadrant: Secondary | ICD-10-CM

## 2018-12-27 ENCOUNTER — Ambulatory Visit
Admission: RE | Admit: 2018-12-27 | Discharge: 2018-12-27 | Disposition: A | Discharge status: Home / Self Care | Payer: Managed Care, Other (non HMO) | Source: Ambulatory Visit | Attending: General Surgery | Admitting: General Surgery

## 2018-12-27 DIAGNOSIS — N6320 Unspecified lump in the left breast, unspecified quadrant: Secondary | ICD-10-CM

## 2018-12-27 MED ORDER — GADOBUTROL 1 MMOL/ML IV SOLN
8.0000 mL | Freq: Once | INTRAVENOUS | Status: AC | PRN
  Administered 2018-12-27: 8 mL via INTRAVENOUS

## 2018-12-29 ENCOUNTER — Other Ambulatory Visit: Payer: Self-pay | Admitting: General Surgery

## 2018-12-29 DIAGNOSIS — N6320 Unspecified lump in the left breast, unspecified quadrant: Secondary | ICD-10-CM

## 2019-01-10 ENCOUNTER — Other Ambulatory Visit: Payer: BLUE CROSS/BLUE SHIELD

## 2019-03-06 ENCOUNTER — Other Ambulatory Visit: Payer: BLUE CROSS/BLUE SHIELD

## 2019-03-16 ENCOUNTER — Other Ambulatory Visit: Payer: Self-pay

## 2019-03-16 ENCOUNTER — Other Ambulatory Visit: Payer: Self-pay | Admitting: Hematology and Oncology

## 2019-03-16 ENCOUNTER — Ambulatory Visit: Payer: BLUE CROSS/BLUE SHIELD

## 2019-03-16 ENCOUNTER — Ambulatory Visit
Admission: RE | Admit: 2019-03-16 | Discharge: 2019-03-16 | Disposition: A | Discharge status: Home / Self Care | Payer: BLUE CROSS/BLUE SHIELD | Source: Ambulatory Visit | Attending: Hematology and Oncology | Admitting: Hematology and Oncology

## 2019-03-16 DIAGNOSIS — N6489 Other specified disorders of breast: Secondary | ICD-10-CM

## 2019-05-06 ENCOUNTER — Other Ambulatory Visit: Payer: Self-pay | Admitting: Hematology and Oncology

## 2019-07-15 ENCOUNTER — Other Ambulatory Visit: Payer: Self-pay | Admitting: Hematology and Oncology

## 2019-07-15 ENCOUNTER — Other Ambulatory Visit: Payer: Self-pay | Admitting: *Deleted

## 2019-07-15 DIAGNOSIS — N644 Mastodynia: Secondary | ICD-10-CM

## 2019-07-15 DIAGNOSIS — Z170 Estrogen receptor positive status [ER+]: Secondary | ICD-10-CM

## 2019-07-15 DIAGNOSIS — C50212 Malignant neoplasm of upper-inner quadrant of left female breast: Secondary | ICD-10-CM

## 2019-07-15 DIAGNOSIS — N6489 Other specified disorders of breast: Secondary | ICD-10-CM

## 2019-07-15 NOTE — Progress Notes (Signed)
Received call from pt stating she has been experiencing a dull ache and stabbing pain in her left breast and down her left arm.  Pt states this is a constant pain that started 2 weeks ago.  Pt denies any redness or inflammation to left extremity.  Pt also denies any recent trauma or injury to extremity.  Per Dr. Lindi Adie, pt to be seen in office Friday 07/17/2019.  Pt to also receive Mammogram and breast US for evaluation.  Orders placed and pt schedule for scan on 07/16/2019 at 1:30 [place] [facility].  Pt notified of apts and verbalized understanding.

## 2019-07-16 ENCOUNTER — Ambulatory Visit
Admission: RE | Admit: 2019-07-16 | Discharge: 2019-07-16 | Disposition: A | Discharge status: Home / Self Care | Payer: BLUE CROSS/BLUE SHIELD | Source: Ambulatory Visit | Attending: Hematology and Oncology | Admitting: Hematology and Oncology

## 2019-07-16 ENCOUNTER — Ambulatory Visit
Admission: RE | Admit: 2019-07-16 | Discharge: 2019-07-16 | Disposition: A | Discharge status: Home / Self Care | Payer: BC Managed Care – PPO | Source: Ambulatory Visit | Attending: Hematology and Oncology | Admitting: Hematology and Oncology

## 2019-07-16 ENCOUNTER — Other Ambulatory Visit: Payer: Self-pay

## 2019-07-16 DIAGNOSIS — N6489 Other specified disorders of breast: Secondary | ICD-10-CM

## 2019-07-16 DIAGNOSIS — N644 Mastodynia: Secondary | ICD-10-CM

## 2019-07-16 NOTE — Progress Notes (Signed)
Patient Care Team: Vania Rea, MD as PCP - General (Obstetrics and Gynecology)  DIAGNOSIS:    ICD-10-CM   1. Malignant neoplasm of upper-inner quadrant of left breast in female, estrogen receptor positive ([place])  C50.212 influenza vac split quadrivalent PF (FLUARIX) injection 0.5 mL   Z17.0     SUMMARY OF ONCOLOGIC HISTORY: Oncology History  Malignant neoplasm of upper-inner quadrant of left breast in female, estrogen receptor positive ([place])  09/13/2016 Initial Diagnosis   Left breast biopsy UIQ: Grade 1 invasive ductal carcinoma ER 95%, PR 90%, Ki-67 15%, HER-2 negative ratio 1.41; screening detected left breast mass at 11:30 position 6 mm size, axilla negative, T1 BN 0 stage IA clinical stage   09/27/2016 Surgery   Left Lumpectomy: IDC 2 tumors 1.2 cm and 1 cm, 0/3 LN Neg, Positive posterior margin (Muscle),  Er 95%, PR 90% Ki67: 15%, Her 2 Neg, T1bN0 Stage 1A   10/08/2016 Oncotype testing   Oncotype DX score 18: Risk of recurrence with tamoxifen for 5 years at 12%, low risk   11/20/2016 - 01/07/2017 Radiation Therapy   Adjuvant radiation therapy   01/09/2017 -  Anti-estrogen oral therapy   Anastrozole 1 mg by mouth daily      CHIEF COMPLIANT: Follow-up of left breast cancer on anastrozole  INTERVAL HISTORY: Bonnie Marquez is a 58 y.o. with above-mentioned history of left breast cancer treated with lumpectomy, radiation, and who is currently on anti-estrogen therapy with anastrozole. I last saw her 10 months ago. Mammogram on [place] on 07/16/19 showed no suspicious abnormalities or evidence of malignancy. She presents to the clinic today for follow-up.  She had sharp shooting pains in the breast and underwent a mammogram and ultrasound which were negative for any malignancy. She was interested to find out if she can get genetic testing.   REVIEW OF SYSTEMS:   Constitutional: Denies fevers, chills or abnormal weight loss Eyes: Denies blurriness of vision Ears, nose, mouth,  throat, and face: Denies mucositis or sore throat Respiratory: Denies cough, dyspnea or wheezes Cardiovascular: Denies palpitation, chest discomfort Gastrointestinal: Denies nausea, heartburn or change in bowel habits Skin: Denies abnormal skin rashes Lymphatics: Denies new lymphadenopathy or easy bruising Neurological: Denies numbness, tingling or new weaknesses Behavioral/Psych: Mood is stable, no new changes  Extremities: No lower extremity edema Breast: denies any pain or lumps or nodules in either breasts All other systems were reviewed with the patient and are negative.  I have reviewed the past medical history, past surgical history, social history and family history with the patient and they are unchanged from previous note.  ALLERGIES:  is allergic to codeine.  MEDICATIONS:  Current Outpatient Medications  Medication Sig Dispense Refill  . anastrozole (ARIMIDEX) 1 MG tablet Take 1 tablet (1 mg total) by mouth daily. 90 tablet 3  . triamterene-hydrochlorothiazide (DYAZIDE) 37.5-25 MG capsule Take 1 capsule by mouth daily.     No current facility-administered medications for this visit.     PHYSICAL EXAMINATION: ECOG PERFORMANCE STATUS: 1 - Symptomatic but completely ambulatory  Vitals:   07/17/19 0832  BP: (!) 144/92  Pulse: 85  Resp: 18  Temp: 98 F (36.7 C)  SpO2: 96%   There were no vitals filed for this visit.  GENERAL: alert, no distress and comfortable SKIN: skin color, texture, turgor are normal, no rashes or significant lesions EYES: normal, Conjunctiva are pink and non-injected, sclera clear OROPHARYNX: no exudate, no erythema and lips, buccal mucosa, and tongue normal  NECK: supple, thyroid  normal size, non-tender, without nodularity LYMPH: no palpable lymphadenopathy in the cervical, axillary or inguinal LUNGS: clear to auscultation and percussion with normal breathing effort HEART: regular rate & rhythm and no murmurs and no lower extremity edema  ABDOMEN: abdomen soft, non-tender and normal bowel sounds MUSCULOSKELETAL: no cyanosis of digits and no clubbing  NEURO: alert & oriented x 3 with fluent speech, no focal motor/sensory deficits EXTREMITIES: No lower extremity edema BREAST: No palpable masses or nodules in either right or left breasts. No palpable axillary supraclavicular or infraclavicular adenopathy no breast tenderness or nipple discharge. (exam performed in the presence of a chaperone)  LABORATORY DATA:  I have reviewed the data as listed CMP Latest Ref Rng & Units 09/19/2016  Glucose 70 - 140 mg/dl 92  BUN 7.0 - 26.0 mg/dL 15.2  Creatinine 0.6 - 1.1 mg/dL 0.8  Sodium 136 - 145 mEq/L 138  Potassium 3.5 - 5.1 mEq/L 3.9  CO2 22 - 29 mEq/L 27  Calcium 8.4 - 10.4 mg/dL 10.6(H)  Total Protein 6.4 - 8.3 g/dL 7.7  Total Bilirubin 0.20 - 1.20 mg/dL 1.28(H)  Alkaline Phos 40 - 150 U/L 78  AST 5 - 34 U/L 50(H)  ALT 0 - 55 U/L 57(H)    Lab Results  Component Value Date   WBC 5.5 09/19/2016   HGB 14.4 09/19/2016   HCT 42.7 09/19/2016   MCV 94.5 09/19/2016   PLT 254 09/19/2016   NEUTROABS 3.2 09/19/2016    ASSESSMENT & PLAN:  Malignant neoplasm of upper-inner quadrant of left breast in female, estrogen receptor positive ([place]) Left Lumpectomy 09/27/16: IDC 2 tumors 1.2 cm and 1 cm, 0/3 LN Neg, Positive posterior margin (Muscle), Er 95%, PR 90% Ki67: 15%, Her 2 Neg, T1bN0 Stage 1A  Oncotype DX recurrence score 18: Risk of recurrence 12% Adjuvant radiation started 11/20/2016 completed 01/07/2017  Current treatment:Adjuvant antiestrogen therapy with Anastrozole 2.5 mg daily for 5-7 years started 01/07/2017   Anastrozole toxicities: Mild to moderate arthralgias and myalgias. The joint pains and stiffness improved after she started taking calcium 400 mg daily.  Breast cancer surveillance: 1.  Mammogram and ultrasound 07/16/2019: Performed because of sharp pains in the left breast: Benign 2. breast examination  07/17/2019:  Slight palpable nodularity but otherwise benign. 3.  Bone density: T score -2 osteopeniaDecember 2019  Patient has family history of breast and ovarian cancer and we will try to get genetics consultation.  Return to clinic in 1 year for follow-up with a MyChart video visit    No orders of the defined types were placed in this encounter.  The patient has a good understanding of the overall plan. she agrees with it. she will call with any problems that may develop before the next visit here.  Nicholas Lose, MD 07/17/2019  Julious Oka Dorshimer am acting as scribe for Dr. Nicholas Lose.  I have reviewed the above documentation for accuracy and completeness, and I agree with the above.

## 2019-07-17 ENCOUNTER — Encounter: Payer: Self-pay | Admitting: Hematology and Oncology

## 2019-07-17 ENCOUNTER — Ambulatory Visit (HOSPITAL_BASED_OUTPATIENT_CLINIC_OR_DEPARTMENT_OTHER): Payer: BC Managed Care – PPO | Admitting: Genetic Counselor

## 2019-07-17 ENCOUNTER — Inpatient Hospital Stay: Payer: BC Managed Care – PPO

## 2019-07-17 ENCOUNTER — Inpatient Hospital Stay: Payer: BC Managed Care – PPO | Attending: Hematology and Oncology | Admitting: Hematology and Oncology

## 2019-07-17 ENCOUNTER — Encounter: Payer: Self-pay | Admitting: Genetic Counselor

## 2019-07-17 ENCOUNTER — Other Ambulatory Visit: Payer: Self-pay | Admitting: Genetic Counselor

## 2019-07-17 DIAGNOSIS — Z803 Family history of malignant neoplasm of breast: Secondary | ICD-10-CM | POA: Insufficient documentation

## 2019-07-17 DIAGNOSIS — Z79811 Long term (current) use of aromatase inhibitors: Secondary | ICD-10-CM | POA: Insufficient documentation

## 2019-07-17 DIAGNOSIS — Z170 Estrogen receptor positive status [ER+]: Secondary | ICD-10-CM

## 2019-07-17 DIAGNOSIS — C50212 Malignant neoplasm of upper-inner quadrant of left female breast: Secondary | ICD-10-CM | POA: Diagnosis not present

## 2019-07-17 DIAGNOSIS — Z923 Personal history of irradiation: Secondary | ICD-10-CM | POA: Insufficient documentation

## 2019-07-17 DIAGNOSIS — Z79899 Other long term (current) drug therapy: Secondary | ICD-10-CM | POA: Insufficient documentation

## 2019-07-17 DIAGNOSIS — Z23 Encounter for immunization: Secondary | ICD-10-CM | POA: Diagnosis not present

## 2019-07-17 DIAGNOSIS — Z8041 Family history of malignant neoplasm of ovary: Secondary | ICD-10-CM | POA: Insufficient documentation

## 2019-07-17 DIAGNOSIS — I10 Essential (primary) hypertension: Secondary | ICD-10-CM | POA: Diagnosis not present

## 2019-07-17 MED ORDER — ANASTROZOLE 1 MG PO TABS: 1.0000 mg | ORAL_TABLET | Freq: Every day | ORAL | 3 refills | Status: DC

## 2019-07-17 MED ORDER — INFLUENZA VAC SPLIT QUAD 0.5 ML IM SUSY
0.5000 mL | PREFILLED_SYRINGE | Freq: Once | INTRAMUSCULAR | Status: AC
  Administered 2019-07-17: 09:00:00 0.5 mL via INTRAMUSCULAR

## 2019-07-17 MED ORDER — INFLUENZA VAC SPLIT QUAD 0.5 ML IM SUSY
PREFILLED_SYRINGE | INTRAMUSCULAR | Status: AC
  Filled 2019-07-17: qty 0.5

## 2019-07-17 NOTE — Progress Notes (Signed)
REFERRING PROVIDER: Nicholas Lose, MD [place],  [place] [id number]  PRIMARY PROVIDER:  Vania Rea, MD  PRIMARY REASON FOR VISIT:  1. Malignant neoplasm of upper-inner quadrant of left breast in female, estrogen receptor positive ([place])   2. Family history of breast cancer   3. Family history of ovarian cancer      HISTORY OF PRESENT ILLNESS:   Bonnie Marquez, a 58 y.o. female, was seen for a [facility] cancer genetics consultation at the request of Dr. Lindi Adie due to a personal and family history of cancer.  Bonnie Marquez presents to clinic today to discuss the possibility of a hereditary predisposition to cancer, genetic testing, and to further clarify her future cancer risks, as well as potential cancer risks for family members.   In 2017, at the age of 65, Bonnie Marquez was diagnosed with invasive ductal carcinoma of the left breast. The treatment plan included lumpectomy and radiation.  The tumor was ER+/PR+/Her2-.     CANCER HISTORY:  Oncology History  Malignant neoplasm of upper-inner quadrant of left breast in female, estrogen receptor positive ([place])  09/13/2016 Initial Diagnosis   Left breast biopsy UIQ: Grade 1 invasive ductal carcinoma ER 95%, PR 90%, Ki-67 15%, HER-2 negative ratio 1.41; screening detected left breast mass at 11:30 position 6 mm size, axilla negative, T1 BN 0 stage IA clinical stage   09/27/2016 Surgery   Left Lumpectomy: IDC 2 tumors 1.2 cm and 1 cm, 0/3 LN Neg, Positive posterior margin (Muscle),  Er 95%, PR 90% Ki67: 15%, Her 2 Neg, T1bN0 Stage 1A   10/08/2016 Oncotype testing   Oncotype DX score 18: Risk of recurrence with tamoxifen for 5 years at 12%, low risk   11/20/2016 - 01/07/2017 Radiation Therapy   Adjuvant radiation therapy   01/09/2017 -  Anti-estrogen oral therapy   Anastrozole 1 mg by mouth daily       RISK FACTORS:  Menarche was at age 66.  First live birth at age 66.  OCP use for approximately 20 years years.   Ovaries intact: yes.  Hysterectomy: no.  Menopausal status: postmenopausal.  HRT use: 0 years. Colonoscopy: yes; 1-2 polyps, scanned every 5 yeras. Mammogram within the last year: yes. Number of breast biopsies: 1. Up to date with pelvic exams: yes. Any excessive radiation exposure in the past: no  Past Medical History:  Diagnosis Date  . Breast cancer ([place]) 2017   left breast CA  . Cancer ([place])    left breast  . Complication of anesthesia    hard to wake up per pt  . Family history of breast cancer   . Family history of ovarian cancer   . Hypertension   . Personal history of radiation therapy 2018    Past Surgical History:  Procedure Laterality Date  . APPENDECTOMY  1975  . BREAST BIOPSY Left 09/07/2016  . BREAST BIOPSY Left 09/13/2016  . BREAST LUMPECTOMY Left 2017  . BREAST LUMPECTOMY WITH RADIOACTIVE SEED AND SENTINEL LYMPH NODE BIOPSY Left 09/27/2016   Procedure: LEFT BREAST LUMPECTOMY WITH TWO  RADIOACTIVE SEEDS AND SENTINEL LYMPH NODE BIOPSY;  Surgeon: Autumn Messing III, MD;  Location: [facility];  Service: General;  Laterality: Left;  . [place]   x 2  . COSMETIC SURGERY     face lift  . EYE MUSCLE SURGERY  1976   bilateral    Social History   Socioeconomic History  . Marital status: Married  Spouse name: Not on file  . Number of children: 2  . Years of education: Not on file  . Highest education level: Not on file  Occupational History  . Occupation: desk top support  Social Needs  . Financial resource strain: Not on file  . Food insecurity    Worry: Not on file    Inability: Not on file  . Transportation needs    Medical: Not on file    Non-medical: Not on file  Tobacco Use  . Smoking status: Never Smoker  . Smokeless tobacco: Never Used  Substance and Sexual Activity  . Alcohol use: No    Frequency: Never  . Drug use: No  . Sexual activity: Yes    Birth control/protection: None  Lifestyle  . Physical  activity    Days per week: Not on file    Minutes per session: Not on file  . Stress: Not on file  Relationships  . Social Herbalist on phone: Not on file    Gets together: Not on file    Attends religious service: Not on file    Active member of club or organization: Not on file    Attends meetings of clubs or organizations: Not on file    Relationship status: Not on file  Other Topics Concern  . Not on file  Social History Narrative  . Not on file     FAMILY HISTORY:  We obtained a detailed, 4-generation family history.  Significant diagnoses are listed below: Family History  Problem Relation Age of Onset  . Hypertension Mother   . Diabetes Mother   . Uterine cancer Mother   . Hypertension Father   . Lung cancer Father   . Stroke Sister   . Heart attack Brother   . Breast cancer Maternal Grandmother        dx in her 44s  . Breast cancer Paternal Grandmother 40  . Cancer Maternal Aunt        2 mat aunts with unknown cancer  . Cancer Paternal Uncle        1 pat uncle with unknown cancer  . Colon cancer Neg Hx   . Stomach cancer Neg Hx     The patient has two sons who are cancer free.  She has one brother and four sisters who are cancer free.  Both parents are deceased.  The patient's father died of lung cancer.  He had three brothers and a sister.  One brother had cancer.  The paternal grandparents did not have cancer that was reported.  The patient's mother had seven siblings.  Two aunts reportedly had some form of cancer.  The maternal grandmother had breast cancer in her 38's.  Bonnie Marquez is unaware of previous family history of genetic testing for hereditary cancer risks. Patient's maternal ancestors are of Caucasian descent, and paternal ancestors are of Caucasian descent. There is no reported Ashkenazi Jewish ancestry. There is no known consanguinity.  GENETIC COUNSELING ASSESSMENT: Bonnie Marquez is a 58 y.o. female with a personal and family history of  cancer which is somewhat suggestive of a familial cancer syndrome with a lower predisposition to cancer. We, therefore, discussed and recommended the following at today's visit.   DISCUSSION: We discussed that 5 - 10% of breast cancer is hereditary, with most cases associated with BRCA mutations.  There are other genes that can be associated with hereditary breast cancer syndromes.   We discussed that testing is beneficial for  several reasons including knowing how to follow individuals after completing their treatment, identifying whether potential treatment options such as PARP inhibitors would be beneficial, and understand if other family members could be at risk for cancer and allow them to undergo genetic testing.   We discussed with Bonnie Marquez that the personal and family history does not meet insurance or NCCN criteria for genetic testing and, therefore, is not highly consistent with a familial hereditary cancer syndrome.  We feel she is at low risk to harbor a gene mutation associated with such a condition. She is interested in pursuing genetic testing and is willing to pay for this out of pocket.  The cost would be $250.  We reviewed the characteristics, features and inheritance patterns of hereditary cancer syndromes. We also discussed genetic testing, including the appropriate family members to test, the process of testing, insurance coverage and turn-around-time for results. We discussed the implications of a negative, positive, carrier and/or variant of uncertain significant result. We recommended Bonnie Marquez pursue genetic testing for the CustomNext-Cancer+RNAinsight gene panel. The CustomNext-Cancer gene panel offered by [facility] and includes sequencing and rearrangement analysis for the following 91 genes: AIP, ALK, APC*, ATM*, AXIN2, BAP1, BARD1, BLM, BMPR1A, BRCA1*, BRCA2*, BRIP1*, CDC73, CDH1*, CDK4, CDKN1B, CDKN2A, CHEK2*, CTNNA1, DICER1, FANCC, FH, FLCN, GALNT12, KIF1B, LZTR1, MAX,  MEN1, MET, MLH1*, MRE11A, MSH2*, MSH3, MSH6*, MUTYH*, NBN, NF1*, NF2, NTHL1, PALB2*, PHOX2B, PMS2*, POT1, PRKAR1A, PTCH1, PTEN*, RAD50, RAD51C*, RAD51D*, RB1, RECQL, RET, SDHA, SDHAF2, SDHB, SDHC, SDHD, SMAD4, SMARCA4, SMARCB1, SMARCE1, STK11, SUFU, TMEM127, TP53*, TSC1, TSC2, VHL and XRCC2 (sequencing and deletion/duplication); CASR, CFTR, CPA1, CTRC, EGFR, EGLN1, FAM175A, HOXB13, KIT, MITF, MLH3, PALLD, PDGFRA, POLD1, POLE, PRSS1, RINT1, RPS20, SPINK1 and TERT (sequencing only); EPCAM and GREM1 (deletion/duplication only). DNA and RNA analyses performed for * genes.   PLAN: After considering the risks, benefits, and limitations, Bonnie Marquez provided informed consent to pursue genetic testing and the blood sample was sent to [facility] for analysis of the CustomNext-Cancer-RNAinsight. Results should be available within approximately 2-3 weeks' time, at which point they will be disclosed by telephone to Bonnie Marquez, as will any additional recommendations warranted by these results. Bonnie Marquez will receive a summary of her genetic counseling visit and a copy of her results once available. This information will also be available in Epic.   Lastly, we encouraged Bonnie Marquez to remain in contact with cancer genetics annually so that we can continuously update the family history and inform her of any changes in cancer genetics and testing that may be of benefit for this family.   Bonnie Marquez questions were answered to her satisfaction today. Our contact information was provided should additional questions or concerns arise. Thank you for the referral and allowing [place] to share in the care of your patient.   Karen P. Florene Glen, [place], [facility] Licensed, Insurance risk surveyor [email or web address] phone: [phone]  The patient was seen for a total of 45 minutes in face-to-face genetic counseling.  This patient was discussed with Drs. Magrinat, Lindi Adie and/or Burr Medico who agrees with the  above.    _______________________________________________________________________ For Office Staff:  Number of people involved in session: 1 Was an Intern/ student involved with case: no

## 2019-07-17 NOTE — Assessment & Plan Note (Signed)
Left Lumpectomy 09/27/16: IDC 2 tumors 1.2 cm and 1 cm, 0/3 LN Neg, Positive posterior margin (Muscle), Er 95%, PR 90% Ki67: 15%, Her 2 Neg, T1bN0 Stage 1A  Oncotype DX recurrence score 18: Risk of recurrence 12% Adjuvant radiation started 11/20/2016 completed 01/07/2017  Current treatment:Adjuvant antiestrogen therapy with Anastrozole 2.5 mg daily for 5-7 years started 01/07/2017   Anastrozole toxicities: Mild to moderate arthralgias and myalgias.  Breast cancer surveillance: 1.  Mammogram 03/16/2019: Probably benign asymmetry lateral left breast is stable, no suspicious findings at the lumpectomy site in the left breast. Left breast mammogram 07/16/2019: Followed by ultrasound: No suspicious mammographic findings. 2. breast examination 07/17/2019:  Slight palpable nodularity but otherwise benign. 3.  Bone density: T score -2 osteopeniaDecember 2019  Return to clinic in 1 year for follow-up

## 2019-07-20 ENCOUNTER — Telehealth: Payer: Self-pay | Admitting: Hematology and Oncology

## 2019-07-20 NOTE — Telephone Encounter (Signed)
I talk with patient regarding 2021 video visit

## 2019-08-31 ENCOUNTER — Telehealth: Payer: Self-pay | Admitting: Genetic Counselor

## 2019-08-31 ENCOUNTER — Encounter: Payer: Self-pay | Admitting: Genetic Counselor

## 2019-08-31 ENCOUNTER — Ambulatory Visit: Payer: Self-pay | Admitting: Genetic Counselor

## 2019-08-31 DIAGNOSIS — Z1379 Encounter for other screening for genetic and chromosomal anomalies: Secondary | ICD-10-CM | POA: Insufficient documentation

## 2019-08-31 NOTE — Progress Notes (Signed)
GENETIC TEST RESULTS   Patient Name: Bonnie Marquez Patient Age: 58 y.o. Encounter Date: 08/31/2019  Referring Provider: Nicholas Lose, MD    Ms. Dalporto was seen in the [place] clinic on July 17, 2019 due to a personal and family history of cancer and concern regarding a hereditary predisposition to cancer in the family. Please refer to the prior Genetics clinic note for more information regarding Ms. Maybee's medical and family histories and our assessment at the time.   FAMILY HISTORY:  We obtained a detailed, 4-generation family history.  Significant diagnoses are listed below: Family History  Problem Relation Age of Onset   Hypertension Mother    Diabetes Mother    Uterine cancer Mother    Hypertension Father    Lung cancer Father    Stroke Sister    Heart attack Brother    Breast cancer Maternal Grandmother        dx in her 93s   Breast cancer Paternal Grandmother 109   Cancer Maternal Aunt        2 mat aunts with unknown cancer   Cancer Paternal Uncle        1 pat uncle with unknown cancer   Colon cancer Neg Hx    Stomach cancer Neg Hx     The patient has two sons who are cancer free.  She has one brother and four sisters who are cancer free.  Both parents are deceased.  The patient's father died of lung cancer.  He had three brothers and a sister.  One brother had cancer.  The paternal grandparents did not have cancer that was reported.  The patient's mother had seven siblings.  Two aunts reportedly had some form of cancer.  The maternal grandmother had breast cancer in her 78's.  Ms. Carneiro is unaware of previous family history of genetic testing for hereditary cancer risks. Patient's maternal ancestors are of Caucasian descent, and paternal ancestors are of Caucasian descent. There is no reported Ashkenazi Jewish ancestry. There is no known consanguinity.  GENETIC TESTING:  At the time of Ms. Long's visit, we recommended she  pursue genetic testing of the CustomNext-Cancer+RNAinsight test. 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 There were no deleterious mutations in AIP, ALK, APC*, ATM*, AXIN2, BAP1, BARD1, BLM, BMPR1A, BRCA1*, BRCA2*, BRIP1*, CDC73, CDH1*, CDK4, CDKN1B, CDKN2A, CHEK2*, CTNNA1, DICER1, FANCC, FH, FLCN, GALNT12, KIF1B, LZTR1, MAX, MEN1, MET, MLH1*, MRE11A, MSH2*, MSH3, MSH6*, MUTYH*, NBN, NF1*, NF2, NTHL1, PALB2*, PHOX2B, PMS2*, POT1, PRKAR1A, PTCH1, PTEN*, RAD50, RAD51C*, RAD51D*, RB1, RECQL, RET, SDHA, SDHAF2, SDHB, SDHC, SDHD, SMAD4, SMARCA4, SMARCB1, SMARCE1, STK11, SUFU, TMEM127, TP53*, TSC1, TSC2, VHL and XRCC2 (sequencing and deletion/duplication); CASR, CPA1, CTRC, EGFR, EGLN1, FAM175A, HOXB13, KIT, MITF, MLH3, PALLD, PDGFRA, POLD1, POLE, PRSS1, RINT1, RPS20, SPINK1 and TERT (sequencing only); EPCAM and GREM1 (deletion/duplication only). DNA and RNA analyses performed for * genes. .    Clinical condition The CFTR gene is associated with autosomal recessive cystic fibrosis (MedGen UID: 25638). Other CFTR-related disorders include congenital bilateral absence of the vas deferens (CBAVD; MedGen UID: 93734) and an increased risk for pancreatitis (PMID: 28768115, 72620355).  Cystic fibrosis (CF) is characterized by the buildup of mucus that can damage the digestive, respiratory, and reproductive systems (PMID: 97416384). The severity of CF ranges from mild to severe and there is significant variability in presentation, even within a family (PMID: 53646803,  16109604, 54098119). Symptoms include chronic cough, recurring chest colds, wheezing, shortness of breath, recurring sinus infections, excessive sweating, and poor growth. In classic CF, symptoms begin in early childhood, with average life expectancy in the 38's. As the disease progresses,  mucus buildup and recurring infections lead to irreparable lung damage and pulmonary complications, which are considered to be the primary cause of mortality (Cystic Fibrosis Foundation. http://www.anderson-foster.com/. ?Accessed November 26, 2016). In addition to pulmonary disease, around 80-90% of individuals with classic CF have pancreatic insufficiency, which causes digestive problems, malnutrition, and poor growth (PMID: 14782956). Other symptoms include meconium ileus at birth (~20% of affected newborns), diabetes (20% of adolescents; 40-50% of adults), liver disease (5-10% of affected individuals in the first decade of life), and female infertility (>95% of affected males; PMID: 7657646129, 28413244, 01027253).  In mild cases of CF, symptoms may present later in life, with less severity, and may not impact life expectancy (PMID: 66440347).  CBAVD is associated with female infertility and refers to bilateral hypoplasia or aplasia of the vas deferens and seminal vesicles. Men are typically azoospermic but have normal testicular function and spermatogenesis, and are therefore able to have biological children with assisted reproductive technology (PMID: 42595638).  Hereditary pancreatitis is characterized by recurring episodes of acute inflammation of the pancreas beginning in childhood or adolescence and leading to chronic pancreatitis. The clinical presentation is highly variable and may include chronic abdominal pain, impairment of endocrine and exocrine pancreatic function, nausea and vomiting, maldigestion, and diabetes (VFIE:33295188). Pathogenic variants in CFTR may confer an approximately four- to ten-fold increased risk for chronic pancreatitis in heterozygous carriers. Other genetic and environmental risk factors are also known to modify this risk (PMID: 41660630, 1601093, 23557322, 02542706, 23762831, 51761607, 37106269). Chronic pancreatitis is a risk factor for pancreatic cancer (PMID:  48546270).  Inheritance Cystic fibrosis and CBAVD exhibit autosomal recessive inheritance, and the affected individuals have two pathogenic variants-one in each copy of their CFTR genes. Affected individuals will pass one pathogenic CFTR variant to all of their children. For partners who each carry a pathogenic variant in CFTR, the risk of having have an affected child is 25% (per pregnancy).  The increased risk for hereditary pancreatitis follows an autosomal dominant pattern of inheritance. This means that an individual with a single pathogenic variant has a 50% chance of passing along that variant and the increased disease risk to their offspring.  Family members of individuals with pathogenic CFTR variants may consider genetic testing, as they may be carriers as well.  Management of affected individuals While there is currently no cure for CF, early treatment can improve quality of life and increase life expectancy. Individuals with CF should be followed by a [facility] for monitoring and treatment. Symptom-specific and age-related management guidelines have been established (PMID: 35009381, 82993716, 96789381, 01751025, 85277824, 23536144, 31540086, 76195093, 26712458).  Treatments include nutritional supplements and respiratory therapies primarily focused on managing symptoms. Treatment is initiated upon diagnosis, and new prophylactic treatment options are continually evolving (PMID: 09983382, 50539767).  Additional treatment may include:   prophylactic antibiotics (PMID: 34193790)  vaccinations (including influenza, pneumococcal polysaccharide [PPSV23], and pneumococcal conjugate; PMID: 24097353)  chest physiotherapy (PMID: 29924268, 34196222)  bronchodilator medications to increase airflow to the lungs as needed (PMID: 97989211)  high-salt diet (PMID: 94174081)  pancreatic enzymes (PMID: 44818563, 14970263)  nutritional therapies and  supplements (PMID: 78588502)  CFTR modulator therapies and variant-specific medications (PMID: 77412878)  surgeries, including sinus surgery and organ transplants (including lung, liver,  kidney; PMID: 97989211,94174081, 44818563, 14970263)  fertility treatment for female infertility/CBAVD (PMID: 78588502)  Monitoring recommendations include quarterly visits for physical exam and testing, including respiratory cultures, pulmonary function, liver function, radiographs, chest CTs, and various blood tests (PMID 77412878). Psychiatric evaluation annually is also recommended to check for depression and anxiety. (PMID: 67672094).  Additionally, affected individuals should avoid respiratory irritants (such as cigarette smoke), dehydration, and close contact with individuals with respiratory infections (PMID: 70962836). Family members and caregivers should also receive annual influenza vaccination (PMID: 62947654).  FAMILY MEMBERS: It is important that all of Ms. Riss's relatives (both men and women) know of the presence of this gene mutation. Site-specific genetic testing can sort out who in the family is at risk and who is not.   Ms. Hobday children and siblings have a 50% chance to have inherited this mutation. We recommend they have genetic testing for this same mutation, as identifying the presence of this mutation would allow them to also take advantage of risk-reducing measures.   Management for carriers Treatment for hereditary pancreatitis primarily focuses on pain management, maldigestion, and monitoring for diabetes and pancreatic cancer (PMID: 65035465). Adhering to a low-fat diet, eating small and frequent meals, taking enzyme supplements, keeping hydrated, and avoiding alcohol and smoking are advised (PMID: 68127517). In some cases, surgery (including total pancreatectomy with islet autotransplantation) may be warranted (PMID: 00174944). Specific recommendations exist for the treatment of  pancreatic diabetes (PMID: 96759163).  Carriers are also at an increased risk of having a child affected with CF. For those of reproductive age, partners of known carriers should consider genetic testing to determine their reproductive risk. Around 1 in 33 people in the [place] are carriers of CF ([place]. http://www.anderson-foster.com/. ?Accessed November 26, 2016); however, this risk varies slightly based on ethnicity. Reproductive options are available for at-risk carrier couples, including prenatal diagnosis, IVF with preimplantation genetic diagnosis (PGD), gamete donation, and adoption. Additionally, newborn screening for CF is available in every state.  SUPPORT AND RESOURCES: If Ms. Vink is interested in BRCA-specific information and support, there are two groups, Facing Our Risk (www.facingourrisk.com) and Bright Pink (www.brightpink.org) which some people have found useful. They provide opportunities to speak with other individuals from high-risk families. To locate genetic counselors in other cities, visit the website of the [employer] (ArtistMovie.se) and Secretary/administrator for a Social worker by zip code.  We encouraged Ms. Lienau to remain in contact with [place] on an annual basis so we can update her personal and family histories, and let her know of advances in cancer genetics that may benefit the family. Our contact number was provided. Ms. Bethune questions were answered to her satisfaction today, and she knows she is welcome to call anytime with additional questions.   Caven Perine P. Florene Glen, [place], [facility] Licensed, Insurance risk surveyor [place] [email or web address] phone: [phone]

## 2019-08-31 NOTE — Telephone Encounter (Signed)
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  Discussed that the remainder of testing, specifically the breast cancer genes, revealed negative genetic testing.  Discussed that we do not know why she has breast cancer or why there is cancer in the family. It could be due to a different gene that we are not testing, or maybe our current technology may not be able to pick something up.  It will be important for her to keep in contact with genetics to keep up with whether additional testing may be needed.  Explained that family members are eligible for genetic testing for the CF mutation.

## 2019-09-03 ENCOUNTER — Ambulatory Visit: Payer: Managed Care, Other (non HMO) | Admitting: Hematology and Oncology

## 2020-03-14 ENCOUNTER — Telehealth: Payer: Self-pay | Admitting: *Deleted

## 2020-03-14 MED ORDER — BENZONATATE 100 MG PO CAPS: 100.0000 mg | ORAL_CAPSULE | Freq: Three times a day (TID) | ORAL | 0 refills | Status: DC | PRN

## 2020-03-14 NOTE — Telephone Encounter (Signed)
Received call from reporting increase in coughing due to change in weather.  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  Per MD pt to take tessalon pearls 100 mg TID PRN.  Prescription sent to pharmacy on file and pt verbalized understating.

## 2020-04-07 ENCOUNTER — Other Ambulatory Visit: Payer: Self-pay | Admitting: Plastic Surgery

## 2020-07-05 ENCOUNTER — Other Ambulatory Visit: Payer: Self-pay | Admitting: Hematology and Oncology

## 2020-07-05 DIAGNOSIS — Z9889 Other specified postprocedural states: Secondary | ICD-10-CM

## 2020-07-05 DIAGNOSIS — Z853 Personal history of malignant neoplasm of breast: Secondary | ICD-10-CM

## 2020-07-15 ENCOUNTER — Telehealth: Payer: Self-pay | Admitting: Hematology and Oncology

## 2020-07-15 NOTE — Telephone Encounter (Signed)
Contaced patient to verify mychart video visit for pre reg

## 2020-07-17 NOTE — Progress Notes (Signed)
HEMATOLOGY-ONCOLOGY MYCHART VIDEO VISIT PROGRESS NOTE  I connected with Bonnie Marquez on 07/18/2020 at  1:45 PM EDT by MyChart video conference and verified that I am speaking with the correct person using two identifiers.  I discussed the limitations, risks, security and privacy concerns of performing an evaluation and management service by MyChart and the availability of in person appointments.  I also discussed with the patient that there may be a patient responsible charge related to this service. The patient expressed understanding and agreed to proceed.  Patient's Location: Home Physician Location: Clinic  CHIEF COMPLIANT: Follow-up of left breast cancer on anastrozole  INTERVAL HISTORY: Bonnie Marquez is a 59 y.o. female with above-mentioned history of left breast cancer treated with lumpectomy, radiation, and who is currently on anti-estrogen therapy with anastrozole. She presents over MyChart today for follow-up.  Overall her muscle aches and pains are about the same but she is able to tolerate anastrozole therapy.  Denies any lumps or nodules in the breast.   Oncology History  Malignant neoplasm of upper-inner quadrant of left breast in female, estrogen receptor positive ([place])  09/13/2016 Initial Diagnosis   Left breast biopsy UIQ: Grade 1 invasive ductal carcinoma ER 95%, PR 90%, Ki-67 15%, HER-2 negative ratio 1.41; screening detected left breast mass at 11:30 position 6 mm size, axilla negative, T1 BN 0 stage IA clinical stage   09/27/2016 Surgery   Left Lumpectomy: IDC 2 tumors 1.2 cm and 1 cm, 0/3 LN Neg, Positive posterior margin (Muscle),  Er 95%, PR 90% Ki67: 15%, Her 2 Neg, T1bN0 Stage 1A   10/08/2016 Oncotype testing   Oncotype DX score 18: Risk of recurrence with tamoxifen for 5 years at 12%, low risk   11/20/2016 - 01/07/2017 Radiation Therapy   Adjuvant radiation therapy   01/09/2017 -  Anti-estrogen oral therapy   Anastrozole 1 mg by mouth daily    08/28/2019  Genetic Testing   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  There were no other pathogenic mutations identified on the CustomNext-Cancer+RNAinsight panel.  The CustomNext-Cancer gene panel offered by [facility] and includes sequencing and rearrangement analysis for the following 91 genes: AIP, ALK, APC*, ATM*, AXIN2, BAP1, BARD1, BLM, BMPR1A, BRCA1*, BRCA2*, BRIP1*, CDC73, CDH1*, CDK4, CDKN1B, CDKN2A, CHEK2*, CTNNA1, DICER1, FANCC, FH, FLCN, GALNT12, KIF1B, LZTR1, MAX, MEN1, MET, MLH1*, MRE11A, MSH2*, MSH3, MSH6*, MUTYH*, NBN, NF1*, NF2, NTHL1, PALB2*, PHOX2B, PMS2*, POT1, PRKAR1A, PTCH1, PTEN*, RAD50, RAD51C*, RAD51D*, RB1, RECQL, RET, SDHA, SDHAF2, SDHB, SDHC, SDHD, SMAD4, SMARCA4, SMARCB1, SMARCE1, STK11, SUFU, TMEM127, TP53*, TSC1, TSC2, VHL and XRCC2 (sequencing and deletion/duplication); CASR, CFTR, CPA1, CTRC, EGFR, EGLN1, FAM175A, HOXB13, KIT, MITF, MLH3, PALLD, PDGFRA, POLD1, POLE, PRSS1, RINT1, RPS20, SPINK1 and TERT (sequencing only); EPCAM and GREM1 (deletion/duplication only). DNA and RNA analyses performed for * genes. The report date is 08/28/2019.     Observations/Objective:  There were no vitals filed for this visit. There is no height or weight on file to calculate BMI.  I have reviewed the data as listed CMP Latest Ref Rng & Units 09/19/2016  Glucose 70 - 140 mg/dl 92  BUN 7.0 - 26.0 mg/dL 15.2  Creatinine 0.6 - 1.1 mg/dL 0.8  Sodium 136 - 145 mEq/L 138  Potassium 3.5 - 5.1 mEq/L 3.9  CO2 22 - 29 mEq/L 27  Calcium 8.4 - 10.4 mg/dL 10.6(H)  Total Protein 6.4 - 8.3 g/dL 7.7  Total Bilirubin 0.20 - 1.20 mg/dL 1.28(H)  Alkaline Phos  40 - 150 U/L 78  AST 5 - 34 U/L 50(H)  ALT 0 - 55 U/L 57(H)    Lab Results  Component Value Date   WBC 5.5 09/19/2016   HGB 14.4 09/19/2016   HCT 42.7 09/19/2016   MCV 94.5 09/19/2016   PLT 254 09/19/2016   NEUTROABS 3.2 09/19/2016      Assessment Plan:  Malignant neoplasm of  upper-inner quadrant of left breast in female, estrogen receptor positive ([place]) Left Lumpectomy 09/27/16: IDC 2 tumors 1.2 cm and 1 cm, 0/3 LN Neg, Positive posterior margin (Muscle), Er 95%, PR 90% Ki67: 15%, Her 2 Neg, T1bN0 Stage 1A  Oncotype DX recurrence score 18: Risk of recurrence 12% Adjuvant radiation started 11/20/2016 completed 01/07/2017  Current treatment:Adjuvant antiestrogen therapy with Anastrozole 2.5 mg daily for 5-7 years started 01/07/2017   Anastrozole toxicities: Mild to moderate arthralgias and myalgias. The joint pains and stiffness improved after she started taking calcium 400 mg daily.  Breast cancer surveillance: 1.Mammogram  scheduled for 07/21/2020 2.Bone density: T score -2 osteopeniaDecember 2019   Return to clinic in 1 year for follow-up with a MyChart video visit   I discussed the assessment and treatment plan with the patient. The patient was provided an opportunity to ask questions and all were answered. The patient agreed with the plan and demonstrated an understanding of the instructions. The patient was advised to call back or seek an in-person evaluation if the symptoms worsen or if the condition fails to improve as anticipated.   I provided 20 minutes of face-to-face MyChart video visit time during this encounter.    Bonnie Eisenmenger, MD 07/18/2020   I, Molly Dorshimer, am acting as scribe for Nicholas Lose, MD.  I have reviewed the above documentation for accuracy and completeness, and I agree with the above.

## 2020-07-18 ENCOUNTER — Inpatient Hospital Stay: Payer: BC Managed Care – PPO | Attending: Hematology and Oncology | Admitting: Hematology and Oncology

## 2020-07-18 ENCOUNTER — Encounter: Payer: Self-pay | Admitting: Hematology and Oncology

## 2020-07-18 ENCOUNTER — Telehealth: Payer: BC Managed Care – PPO | Admitting: Hematology and Oncology

## 2020-07-18 DIAGNOSIS — C50212 Malignant neoplasm of upper-inner quadrant of left female breast: Secondary | ICD-10-CM

## 2020-07-18 DIAGNOSIS — Z170 Estrogen receptor positive status [ER+]: Secondary | ICD-10-CM

## 2020-07-18 MED ORDER — ANASTROZOLE 1 MG PO TABS: 1.0000 mg | ORAL_TABLET | Freq: Every day | ORAL | 3 refills | Status: DC

## 2020-07-18 NOTE — Assessment & Plan Note (Signed)
Left Lumpectomy 09/27/16: IDC 2 tumors 1.2 cm and 1 cm, 0/3 LN Neg, Positive posterior margin (Muscle), Er 95%, PR 90% Ki67: 15%, Her 2 Neg, T1bN0 Stage 1A  Oncotype DX recurrence score 18: Risk of recurrence 12% Adjuvant radiation started 11/20/2016 completed 01/07/2017  Current treatment:Adjuvant antiestrogen therapy with Anastrozole 2.5 mg daily for 5-7 years started 01/07/2017   Anastrozole toxicities: Mild to moderate arthralgias and myalgias. The joint pains and stiffness improved after she started taking calcium 400 mg daily.  Breast cancer surveillance: 1.Mammogram  scheduled for 07/21/2020 2.breast examination  07/18/2020:Slight palpable nodularity but otherwise benign. 3.  Bone density: T score -2 osteopeniaDecember 2019    Return to clinic in 1 year for follow-up with a MyChart video visit

## 2020-07-21 ENCOUNTER — Ambulatory Visit
Admission: RE | Admit: 2020-07-21 | Discharge: 2020-07-21 | Disposition: A | Discharge status: Home / Self Care | Payer: BC Managed Care – PPO | Source: Ambulatory Visit | Attending: Hematology and Oncology | Admitting: Hematology and Oncology

## 2020-07-21 ENCOUNTER — Other Ambulatory Visit: Payer: Self-pay | Admitting: Hematology and Oncology

## 2020-07-21 ENCOUNTER — Other Ambulatory Visit: Payer: Self-pay

## 2020-07-21 DIAGNOSIS — Z853 Personal history of malignant neoplasm of breast: Secondary | ICD-10-CM

## 2020-07-21 DIAGNOSIS — Z9889 Other specified postprocedural states: Secondary | ICD-10-CM

## 2020-11-10 ENCOUNTER — Other Ambulatory Visit: Payer: BC Managed Care – PPO

## 2021-01-18 ENCOUNTER — Other Ambulatory Visit: Payer: Self-pay | Admitting: Hematology and Oncology

## 2021-01-18 DIAGNOSIS — N6489 Other specified disorders of breast: Secondary | ICD-10-CM

## 2021-01-18 DIAGNOSIS — Z853 Personal history of malignant neoplasm of breast: Secondary | ICD-10-CM

## 2021-01-19 ENCOUNTER — Other Ambulatory Visit: Payer: Self-pay

## 2021-01-19 ENCOUNTER — Other Ambulatory Visit: Payer: Self-pay | Admitting: Hematology and Oncology

## 2021-01-19 ENCOUNTER — Ambulatory Visit
Admission: RE | Admit: 2021-01-19 | Discharge: 2021-01-19 | Disposition: A | Discharge status: Home / Self Care | Payer: BC Managed Care – PPO | Source: Ambulatory Visit | Attending: Hematology and Oncology | Admitting: Hematology and Oncology

## 2021-01-19 ENCOUNTER — Ambulatory Visit: Payer: BC Managed Care – PPO

## 2021-01-19 DIAGNOSIS — Z853 Personal history of malignant neoplasm of breast: Secondary | ICD-10-CM

## 2021-01-19 DIAGNOSIS — N6489 Other specified disorders of breast: Secondary | ICD-10-CM

## 2021-03-02 ENCOUNTER — Other Ambulatory Visit: Payer: BC Managed Care – PPO

## 2021-04-05 ENCOUNTER — Telehealth: Payer: Self-pay | Admitting: *Deleted

## 2021-04-05 NOTE — Telephone Encounter (Signed)
Received call from pt with complaint of dull ache in entire left arm x1 month.  Pt denies recent injury or trauma.  Pt denies swelling, redness or warmth to the touch.  Per MD pt to stop Anastrozole x2 weeks and f/u in office to assess symptoms.  Pt notified and verbalized understanding.

## 2021-04-19 NOTE — Assessment & Plan Note (Signed)
Left Lumpectomy 09/27/16: IDC 2 tumors 1.2 cm and 1 cm, 0/3 LN Neg, Positive posterior margin (Muscle), Er 95%, PR 90% Ki67: 15%, Her 2 Neg, T1bN0 Stage 1A  Oncotype DX recurrence score 18: Risk of recurrence 12% Adjuvant radiation started 11/20/2016 completed 01/07/2017  Current treatment:Adjuvant antiestrogen therapy with Anastrozole 2.5 mg daily for 5-7 years started 01/07/2017  Anastrozole toxicities: Mild to moderate arthralgias and myalgias. The joint pains and stiffness improved after she started taking calcium 400 mg daily.  Breast cancer surveillance: 1.Mammogram 01/19/21: Right Breast asymmetry is not visualized 2.Bone density: T score -2 osteopeniaDecember 2019  Return to clinic in 1 year for follow-upwith a MyChart video visit

## 2021-04-19 NOTE — Progress Notes (Signed)
HEMATOLOGY-ONCOLOGY MYCHART VIDEO VISIT PROGRESS NOTE  I connected with Bonnie Marquez on 04/20/2021 at  9:45 AM EDT by MyChart video conference and verified that I am speaking with the correct person using two identifiers.  I discussed the limitations, risks, security and privacy concerns of performing an evaluation and management service by MyChart and the availability of in person appointments.  I also discussed with the patient that there may be a patient responsible charge related to this service. The patient expressed understanding and agreed to proceed.  Patient's Location: Home Physician Location: Clinic  CHIEF COMPLIANT: Follow-up of left breast cancer on anastrozole  INTERVAL HISTORY: Bonnie Marquez is a 60 y.o. female with above-mentioned history of left breast cancer treated with lumpectomy, radiation, and who is currently on anti-estrogen therapy with anastrozole. Mammogram on [place] on 01/19/21 showed no suspicious abnormalities or evidence of malignancy. She presents over MyChart today for follow-up.  She continues to have joint stiffness and achiness from anastrozole therapy.  She has been on it for over 4 years.  Otherwise she is doing quite well.  She had a repeat mammogram in the right breast in March which did not show any further abnormalities.  Her regular mammogram is set up towards the end of September.  Oncology History  Malignant neoplasm of upper-inner quadrant of left breast in female, estrogen receptor positive ([place])  09/13/2016 Initial Diagnosis   Left breast biopsy UIQ: Grade 1 invasive ductal carcinoma ER 95%, PR 90%, Ki-67 15%, HER-2 negative ratio 1.41; screening detected left breast mass at 11:30 position 6 mm size, axilla negative, T1 BN 0 stage IA clinical stage    09/27/2016 Surgery   Left Lumpectomy: IDC 2 tumors 1.2 cm and 1 cm, 0/3 LN Neg, Positive posterior margin (Muscle),  Er 95%, PR 90% Ki67: 15%, Her 2 Neg, T1bN0 Stage 1A    10/08/2016 Oncotype  testing   Oncotype DX score 18: Risk of recurrence with tamoxifen for 5 years at 12%, low risk    11/20/2016 - 01/07/2017 Radiation Therapy   Adjuvant radiation therapy    01/09/2017 -  Anti-estrogen oral therapy   Anastrozole 1 mg by mouth daily     08/28/2019 Genetic Testing   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  There were no other pathogenic mutations identified on the CustomNext-Cancer+RNAinsight panel.  The CustomNext-Cancer gene panel offered by [facility] and includes sequencing and rearrangement analysis for the following 91 genes: AIP, ALK, APC*, ATM*, AXIN2, BAP1, BARD1, BLM, BMPR1A, BRCA1*, BRCA2*, BRIP1*, CDC73, CDH1*, CDK4, CDKN1B, CDKN2A, CHEK2*, CTNNA1, DICER1, FANCC, FH, FLCN, GALNT12, KIF1B, LZTR1, MAX, MEN1, MET, MLH1*, MRE11A, MSH2*, MSH3, MSH6*, MUTYH*, NBN, NF1*, NF2, NTHL1, PALB2*, PHOX2B, PMS2*, POT1, PRKAR1A, PTCH1, PTEN*, RAD50, RAD51C*, RAD51D*, RB1, RECQL, RET, SDHA, SDHAF2, SDHB, SDHC, SDHD, SMAD4, SMARCA4, SMARCB1, SMARCE1, STK11, SUFU, TMEM127, TP53*, TSC1, TSC2, VHL and XRCC2 (sequencing and deletion/duplication); CASR, CFTR, CPA1, CTRC, EGFR, EGLN1, FAM175A, HOXB13, KIT, MITF, MLH3, PALLD, PDGFRA, POLD1, POLE, PRSS1, RINT1, RPS20, SPINK1 and TERT (sequencing only); EPCAM and GREM1 (deletion/duplication only). DNA and RNA analyses performed for * genes. The report date is 08/28/2019.     Observations/Objective:  There were no vitals filed for this visit. There is no height or weight on file to calculate BMI.  I have reviewed the data as listed CMP Latest Ref Rng & Units 09/19/2016  Glucose 70 - 140 mg/dl 92  BUN 7.0 - 26.0 mg/dL 15.2  Creatinine 0.6 - 1.1  mg/dL 0.8  Sodium 136 - 145 mEq/L 138  Potassium 3.5 - 5.1 mEq/L 3.9  CO2 22 - 29 mEq/L 27  Calcium 8.4 - 10.4 mg/dL 10.6(H)  Total Protein 6.4 - 8.3 g/dL 7.7  Total Bilirubin 0.20 - 1.20 mg/dL 1.28(H)  Alkaline Phos 40 - 150 U/L 78  AST 5 - 34  U/L 50(H)  ALT 0 - 55 U/L 57(H)    Lab Results  Component Value Date   WBC 5.5 09/19/2016   HGB 14.4 09/19/2016   HCT 42.7 09/19/2016   MCV 94.5 09/19/2016   PLT 254 09/19/2016   NEUTROABS 3.2 09/19/2016      Assessment Plan:  Malignant neoplasm of upper-inner quadrant of left breast in female, estrogen receptor positive ([place]) Left Lumpectomy 09/27/16: IDC 2 tumors 1.2 cm and 1 cm, 0/3 LN Neg, Positive posterior margin (Muscle),  Er 95%, PR 90% Ki67: 15%, Her 2 Neg, T1bN0 Stage 1A   Oncotype DX recurrence score 18: Risk of recurrence 12% Adjuvant radiation started 11/20/2016 completed 01/07/2017   Current treatment: Adjuvant antiestrogen therapy with Anastrozole 2.5 mg daily for 7 years started 01/07/2017   Anastrozole toxicities: Mild to moderate arthralgias and myalgias.   Breast cancer surveillance: 1. Mammogram  01/19/21: Right Breast asymmetry is not visualized 2. Bone density: T score -2 osteopenia December 2019.  I recommended that she do a bone density along with her mammogram in September.  She will call and make that appointment at the breast center.   Return to clinic in 1 year for follow-up with a MyChart video visit    I discussed the assessment and treatment plan with the patient. The patient was provided an opportunity to ask questions and all were answered. The patient agreed with the plan and demonstrated an understanding of the instructions. The patient was advised to call back or seek an in-person evaluation if the symptoms worsen or if the condition fails to improve as anticipated.   Total time spent: 20 minutes including face-to-face MyChart video visit time and time spent for planning, charting and coordination of care  Rulon Eisenmenger, MD 04/20/2021  I, Thana Ates am acting as scribe for Nicholas Lose, MD.  I have reviewed the above documentation for accuracy and completeness, and I agree with the above.

## 2021-04-20 ENCOUNTER — Inpatient Hospital Stay: Payer: BC Managed Care – PPO | Attending: Hematology and Oncology | Admitting: Hematology and Oncology

## 2021-04-20 DIAGNOSIS — Z79811 Long term (current) use of aromatase inhibitors: Secondary | ICD-10-CM | POA: Diagnosis not present

## 2021-04-20 DIAGNOSIS — Z170 Estrogen receptor positive status [ER+]: Secondary | ICD-10-CM | POA: Diagnosis not present

## 2021-04-20 DIAGNOSIS — C50212 Malignant neoplasm of upper-inner quadrant of left female breast: Secondary | ICD-10-CM

## 2021-04-20 MED ORDER — ANASTROZOLE 1 MG PO TABS: 1.0000 mg | ORAL_TABLET | Freq: Every day | ORAL | 3 refills | Status: DC

## 2021-07-24 ENCOUNTER — Ambulatory Visit
Admission: RE | Admit: 2021-07-24 | Discharge: 2021-07-24 | Disposition: A | Discharge status: Home / Self Care | Payer: BC Managed Care – PPO | Source: Ambulatory Visit | Attending: Hematology and Oncology | Admitting: Hematology and Oncology

## 2021-07-24 ENCOUNTER — Other Ambulatory Visit: Payer: Self-pay

## 2021-07-24 DIAGNOSIS — N6489 Other specified disorders of breast: Secondary | ICD-10-CM

## 2021-07-24 DIAGNOSIS — Z853 Personal history of malignant neoplasm of breast: Secondary | ICD-10-CM

## 2021-08-11 ENCOUNTER — Telehealth: Payer: Self-pay | Admitting: *Deleted

## 2021-08-11 NOTE — Telephone Encounter (Signed)
Received VM from pt requesting advice from MD if okay to take prednisone prescribed by Orthopedist for joint inflammation while on anastrozole.  Per MD okay for pt to proceed with tx.  RN attempt x1 to contact pt.  No answer, LVM to return call to the office.

## 2021-10-31 ENCOUNTER — Telehealth: Payer: Self-pay

## 2021-10-31 NOTE — Telephone Encounter (Signed)
Pt called and states she has had persistent cough for a couple of weeks despite testing negative for flu/COVID. Pt states MD prescribed z-pack, shoe completed this course and is still having a cough, asking for inhaler to help sleep. Educated pt that inhaler is steroids and would likely not help her sleep. Pt should call PCP and see if they will order CXR and more effective abx if appropriate. Pt verbalized thanks and understanding,

## 2021-11-23 ENCOUNTER — Other Ambulatory Visit: Payer: Self-pay | Admitting: *Deleted

## 2021-11-23 MED ORDER — ANASTROZOLE 1 MG PO TABS: 1.0000 mg | ORAL_TABLET | Freq: Every day | ORAL | 3 refills | Status: DC

## 2022-04-15 ENCOUNTER — Other Ambulatory Visit: Payer: Self-pay | Admitting: Hematology and Oncology

## 2022-05-12 ENCOUNTER — Other Ambulatory Visit: Payer: Self-pay | Admitting: Hematology and Oncology

## 2022-07-04 ENCOUNTER — Other Ambulatory Visit: Payer: Self-pay | Admitting: Hematology and Oncology

## 2022-07-04 DIAGNOSIS — R928 Other abnormal and inconclusive findings on diagnostic imaging of breast: Secondary | ICD-10-CM

## 2022-08-13 ENCOUNTER — Other Ambulatory Visit: Payer: Self-pay | Admitting: Hematology and Oncology

## 2022-08-13 DIAGNOSIS — N6489 Other specified disorders of breast: Secondary | ICD-10-CM

## 2022-08-16 ENCOUNTER — Ambulatory Visit
Admission: RE | Admit: 2022-08-16 | Discharge: 2022-08-16 | Disposition: A | Discharge status: Home / Self Care | Payer: BC Managed Care – PPO | Source: Ambulatory Visit | Attending: Hematology and Oncology | Admitting: Hematology and Oncology

## 2022-08-16 DIAGNOSIS — N6489 Other specified disorders of breast: Secondary | ICD-10-CM

## 2022-09-26 ENCOUNTER — Encounter: Payer: Self-pay | Admitting: Gastroenterology

## 2022-10-17 ENCOUNTER — Encounter: Payer: Self-pay | Admitting: Gastroenterology

## 2022-11-12 ENCOUNTER — Ambulatory Visit (AMBULATORY_SURGERY_CENTER): Payer: BC Managed Care – PPO | Admitting: *Deleted

## 2022-11-12 ENCOUNTER — Other Ambulatory Visit: Payer: Self-pay

## 2022-11-12 VITALS — Ht 63.0 in | Wt 156.0 lb

## 2022-11-12 DIAGNOSIS — Z86010 Personal history of colonic polyps: Secondary | ICD-10-CM

## 2022-11-12 MED ORDER — NA SULFATE-K SULFATE-MG SULF 17.5-3.13-1.6 GM/177ML PO SOLN: 1.0000 | Freq: Once | ORAL | 0 refills | Status: AC

## 2022-11-12 NOTE — Addendum Note (Signed)
Addended by: Jolinda Croak on: 11/12/2022 08:44 AM   Modules accepted: Orders

## 2022-11-12 NOTE — Progress Notes (Signed)
Pre visit completed over telephone.  Instructions forwarded through [place].   No egg or soy allergy known to patient  No issues known to pt with past sedation with any surgeries or procedures Patient denies ever being told they had issues or difficulty with intubation  No FH of Malignant Hyperthermia Pt is not on diet pills Pt is not on  home 02  Pt is not on blood thinners  Pt denies issues with constipation  No A fib or A flutter   Pt instructed to use Singlecare.com or GoodRx for a price reduction on prep

## 2022-11-15 ENCOUNTER — Inpatient Hospital Stay: Payer: BC Managed Care – PPO | Attending: Hematology and Oncology | Admitting: Hematology and Oncology

## 2022-11-15 DIAGNOSIS — Z170 Estrogen receptor positive status [ER+]: Secondary | ICD-10-CM | POA: Insufficient documentation

## 2022-11-15 DIAGNOSIS — C50212 Malignant neoplasm of upper-inner quadrant of left female breast: Secondary | ICD-10-CM | POA: Insufficient documentation

## 2022-11-15 DIAGNOSIS — Z780 Asymptomatic menopausal state: Secondary | ICD-10-CM | POA: Diagnosis not present

## 2022-11-15 DIAGNOSIS — M8580 Other specified disorders of bone density and structure, unspecified site: Secondary | ICD-10-CM | POA: Insufficient documentation

## 2022-11-15 DIAGNOSIS — Z79811 Long term (current) use of aromatase inhibitors: Secondary | ICD-10-CM | POA: Insufficient documentation

## 2022-11-15 DIAGNOSIS — Z923 Personal history of irradiation: Secondary | ICD-10-CM | POA: Insufficient documentation

## 2022-11-15 NOTE — Assessment & Plan Note (Signed)
Left Lumpectomy 09/27/16: IDC 2 tumors 1.2 cm and 1 cm, 0/3 LN Neg, Positive posterior margin (Muscle),  Er 95%, PR 90% Ki67: 15%, Her 2 Neg, T1bN0 Stage 1A   Oncotype DX recurrence score 18: Risk of recurrence 12% Adjuvant radiation started 11/20/2016 completed 01/07/2017   Current treatment: Adjuvant antiestrogen therapy with Anastrozole 2.5 mg daily for 7 years started 01/07/2017   Anastrozole toxicities: Mild to moderate arthralgias and myalgias.   Breast cancer surveillance: 1. Mammogram 08/16/2022: Bilateral mammograms: Benign: Breast density category B 2. Bone density: T score -2 osteopenia December 2019.  She needs a new bone density test.   Return to clinic in 1 year for follow-up with a MyChart video visit

## 2022-11-15 NOTE — Progress Notes (Signed)
HEMATOLOGY-ONCOLOGY [place] VISIT PROGRESS NOTE  I connected with Bonnie Marquez on 11/15/2022 at  2:00 PM EST by Mychart video conference and verified that I am speaking with the correct person using two identifiers.  I discussed the limitations, risks, security and privacy concerns of performing an evaluation and management service by Webex and the availability of in person appointments.  I also discussed with the patient that there may be a patient responsible charge related to this service. The patient expressed understanding and agreed to proceed.  Patient's Location: Home Physician Location: Clinic  CHIEF COMPLIANT: Follow-up on antiestrogen therapy with anastrozole  INTERVAL HISTORY: Bonnie Marquez is a 62 y.o. female with above-mentioned history of left breast cancer.  She connected with me by way of MyChart virtual visit to discuss her tolerance to antiestrogen therapy.  She is tolerating it extremely well without any problems or concerns.  She initially had a lot of muscle aches and pains but after calcium supplementation the symptoms have improved.  Oncology History  Malignant neoplasm of upper-inner quadrant of left breast in female, estrogen receptor positive ([place])  09/13/2016 Initial Diagnosis   Left breast biopsy UIQ: Grade 1 invasive ductal carcinoma ER 95%, PR 90%, Ki-67 15%, HER-2 negative ratio 1.41; screening detected left breast mass at 11:30 position 6 mm size, axilla negative, T1 BN 0 stage IA clinical stage   09/27/2016 Surgery   Left Lumpectomy: IDC 2 tumors 1.2 cm and 1 cm, 0/3 LN Neg, Positive posterior margin (Muscle),  Er 95%, PR 90% Ki67: 15%, Her 2 Neg, T1bN0 Stage 1A   10/08/2016 Oncotype testing   Oncotype DX score 18: Risk of recurrence with tamoxifen for 5 years at 12%, low risk   11/20/2016 - 01/07/2017 Radiation Therapy   Adjuvant radiation therapy   01/09/2017 -  Anti-estrogen oral therapy   Anastrozole 1 mg by mouth daily    08/28/2019 Genetic  Testing   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  There were no other pathogenic mutations identified on the CustomNext-Cancer+RNAinsight panel.  The CustomNext-Cancer gene panel offered by [facility] and includes sequencing and rearrangement analysis for the following 91 genes: AIP, ALK, APC*, ATM*, AXIN2, BAP1, BARD1, BLM, BMPR1A, BRCA1*, BRCA2*, BRIP1*, CDC73, CDH1*, CDK4, CDKN1B, CDKN2A, CHEK2*, CTNNA1, DICER1, FANCC, FH, FLCN, GALNT12, KIF1B, LZTR1, MAX, MEN1, MET, MLH1*, MRE11A, MSH2*, MSH3, MSH6*, MUTYH*, NBN, NF1*, NF2, NTHL1, PALB2*, PHOX2B, PMS2*, POT1, PRKAR1A, PTCH1, PTEN*, RAD50, RAD51C*, RAD51D*, RB1, RECQL, RET, SDHA, SDHAF2, SDHB, SDHC, SDHD, SMAD4, SMARCA4, SMARCB1, SMARCE1, STK11, SUFU, TMEM127, TP53*, TSC1, TSC2, VHL and XRCC2 (sequencing and deletion/duplication); CASR, CFTR, CPA1, CTRC, EGFR, EGLN1, FAM175A, HOXB13, KIT, MITF, MLH3, PALLD, PDGFRA, POLD1, POLE, PRSS1, RINT1, RPS20, SPINK1 and TERT (sequencing only); EPCAM and GREM1 (deletion/duplication only). DNA and RNA analyses performed for * genes. The report date is 08/28/2019.     REVIEW OF SYSTEMS:   Constitutional: Denies fevers, chills or abnormal weight loss Eyes: Denies blurriness of vision Ears, nose, mouth, throat, and face: Denies mucositis or sore throat Respiratory: Denies cough, dyspnea or wheezes Cardiovascular: Denies palpitation, chest discomfort Gastrointestinal:  Denies nausea, heartburn or change in bowel habits Skin: Denies abnormal skin rashes Lymphatics: Denies new lymphadenopathy or easy bruising Neurological:Denies numbness, tingling or new weaknesses Behavioral/Psych: Mood is stable, no new changes  Extremities: No lower extremity edema Breast: denies any pain or lumps or nodules in either breasts All other systems were reviewed with the patient and are negative.  Observations/Objective:  There were no vitals filed for this visit. There  is no height or weight on file to calculate BMI.  I have reviewed the data as listed    Latest Ref Rng & Units 09/19/2016    8:21 AM  CMP  Glucose 70 - 140 mg/dl 92   BUN 7.0 - 26.0 mg/dL 15.2   Creatinine 0.6 - 1.1 mg/dL 0.8   Sodium 136 - 145 mEq/L 138   Potassium 3.5 - 5.1 mEq/L 3.9   CO2 22 - 29 mEq/L 27   Calcium 8.4 - 10.4 mg/dL 10.6   Total Protein 6.4 - 8.3 g/dL 7.7   Total Bilirubin 0.20 - 1.20 mg/dL 1.28   Alkaline Phos 40 - 150 U/L 78   AST 5 - 34 U/L 50   ALT 0 - 55 U/L 57     Lab Results  Component Value Date   WBC 5.5 09/19/2016   HGB 14.4 09/19/2016   HCT 42.7 09/19/2016   MCV 94.5 09/19/2016   PLT 254 09/19/2016   NEUTROABS 3.2 09/19/2016      Assessment Plan:  Malignant neoplasm of upper-inner quadrant of left breast in female, estrogen receptor positive ([place]) Left Lumpectomy 09/27/16: IDC 2 tumors 1.2 cm and 1 cm, 0/3 LN Neg, Positive posterior margin (Muscle),  Er 95%, PR 90% Ki67: 15%, Her 2 Neg, T1bN0 Stage 1A   Oncotype DX recurrence score 18: Risk of recurrence 12% Adjuvant radiation started 11/20/2016 completed 01/07/2017   Current treatment: Adjuvant antiestrogen therapy with Anastrozole 2.5 mg daily for 7 years started 01/07/2017   Anastrozole toxicities: No side effects   Breast cancer surveillance: 1. Mammogram 08/16/2022: Bilateral mammograms: Benign: Breast density category B 2. Bone density: T score -2 osteopenia December 2019.  She needs a new bone density test. I ordered a bone density test to be done along with her mammogram in October at the breast center.  She has a colonoscopy planned for next month. She does not like to come to the clinic or be present in any crowds if she can avoid it.  Therefore we will do virtual visits on her. Return to clinic in 1 year for follow-up with a MyChart video visit   I discussed the assessment and treatment plan with the patient. The patient was provided an opportunity to ask questions and all  were answered. The patient agreed with the plan and demonstrated an understanding of the instructions. The patient was advised to call back or seek an in-person evaluation if the symptoms worsen or if the condition fails to improve as anticipated.   I provided 20 minutes of face-to-face Web Ex time during this encounter.    Rulon Eisenmenger, MD 11/15/2022

## 2022-11-23 ENCOUNTER — Encounter: Payer: Self-pay | Admitting: Gastroenterology

## 2022-12-03 ENCOUNTER — Encounter: Payer: Self-pay | Admitting: Gastroenterology

## 2022-12-03 ENCOUNTER — Ambulatory Visit (AMBULATORY_SURGERY_CENTER): Payer: BC Managed Care – PPO | Admitting: Gastroenterology

## 2022-12-03 VITALS — BP 148/76 | HR 65 | Temp 98.4°F | Resp 15 | Ht 64.0 in | Wt 154.0 lb

## 2022-12-03 DIAGNOSIS — Z86010 Personal history of colonic polyps: Secondary | ICD-10-CM

## 2022-12-03 DIAGNOSIS — Z09 Encounter for follow-up examination after completed treatment for conditions other than malignant neoplasm: Secondary | ICD-10-CM

## 2022-12-03 MED ORDER — SODIUM CHLORIDE 0.9 % IV SOLN: 500.0000 mL | INTRAVENOUS | Status: DC

## 2022-12-03 NOTE — Patient Instructions (Signed)

## 2022-12-03 NOTE — Progress Notes (Signed)
To pacu, VSS. Report to [email or web address] 

## 2022-12-03 NOTE — Op Note (Signed)
[facility] Patient Name: Bonnie Marquez Procedure Date: 12/03/2022 7:58 AM MRN: [id number] Endoscopist: Ladene Artist , MD, [id number] Age: 62 Referring MD:  Date of Birth: 28-Feb-1961 Gender: Female Account #: [id number] Procedure:                Colonoscopy Indications:              High risk colon cancer surveillance: Personal                            history of sessile serrated colon polyp (less than                            10 mm in size) with no dysplasia Medicines:                Monitored Anesthesia Care Procedure:                Pre-Anesthesia Assessment:                           - Prior to the procedure, a History and Physical                            was performed, and patient medications and                            allergies were reviewed. The patient's tolerance of                            previous anesthesia was also reviewed. The risks                            and benefits of the procedure and the sedation                            options and risks were discussed with the patient.                            All questions were answered, and informed consent                            was obtained. Prior Anticoagulants: The patient has                            taken no anticoagulant or antiplatelet agents. ASA                            Grade Assessment: II - A patient with mild systemic                            disease. After reviewing the risks and benefits,                            the patient was deemed in satisfactory condition to  undergo the procedure.                           After obtaining informed consent, the colonoscope                            was passed under direct vision. Throughout the                            procedure, the patient's blood pressure, pulse, and                            oxygen saturations were monitored continuously. The                            CF [id number] #[id number] was  introduced through the anus                            and advanced to the the cecum, identified by                            appendiceal orifice and ileocecal valve. The                            ileocecal valve, appendiceal orifice, and rectum                            were photographed. The quality of the bowel                            preparation was adequate afer extensive lavage,                            suction. The colonoscopy was performed without                            difficulty. The patient tolerated the procedure                            well. Scope In: 8:07:14 AM Scope Out: 8:18:25 AM Scope Withdrawal Time: 0 hours 8 minutes 47 seconds  Total Procedure Duration: 0 hours 11 minutes 11 seconds  Findings:                 The perianal and digital rectal examinations were                            normal.                           The entire examined colon appeared normal on direct                            and retroflexion views. Complications:            No immediate complications. Estimated blood loss:  None. Estimated Blood Loss:     Estimated blood loss: none. Impression:               - The examination appeared normal on direct and                            retroflexion views.                           - No specimens collected. Recommendation:           - Repeat colonoscopy in 10 years for surveillance.                           - Patient has a contact number available for                            emergencies. The signs and symptoms of potential                            delayed complications were discussed with the                            patient. Return to normal activities tomorrow.                            Written discharge instructions were provided to the                            patient.                           - Resume previous diet.                           - Continue present medications. Ladene Artist,  MD 12/03/2022 8:23:19 AM This report has been signed electronically.

## 2022-12-03 NOTE — Progress Notes (Signed)
History & Physical  Primary Care Physician:  Vania Rea, MD Primary Gastroenterologist: Lucio Edward, MD  Impression / Plan:   Personal history of sessile serrated colon polyps for colonoscopy.   CHIEF COMPLAINT: Personal history of colon polyps   HPI: Bonnie Marquez is a 62 y.o. female with a personal history of sessile serrated colon polyps for colonoscopy.   Past Medical History:  Diagnosis Date   Breast cancer ([place]) 2017   left breast CA   Cancer ([place])    left breast   Complication of anesthesia    hard to wake up per pt   Family history of breast cancer    Family history of ovarian cancer    Hypertension    Personal history of radiation therapy 2018    Past Surgical History:  Procedure Laterality Date   APPENDECTOMY  1975   BREAST BIOPSY Left 09/07/2016   BREAST BIOPSY Left 09/13/2016   BREAST LUMPECTOMY Left 2017   BREAST LUMPECTOMY WITH RADIOACTIVE SEED AND SENTINEL LYMPH NODE BIOPSY Left 09/27/2016   Procedure: LEFT BREAST LUMPECTOMY WITH TWO  RADIOACTIVE SEEDS AND SENTINEL LYMPH NODE BIOPSY;  Surgeon: Autumn Messing III, MD;  Location: [facility];  Service: General;  Laterality: Left;   [place], 1999   x 2   COLONOSCOPY     COSMETIC SURGERY     face lift   EYE MUSCLE SURGERY  1976   bilateral   REDUCTION MAMMAPLASTY Right    right breast reduction to match the size of left breast    Prior to Admission medications   Medication Sig Start Date End Date Taking? Authorizing Provider  anastrozole (ARIMIDEX) 1 MG tablet TAKE 1 TABLET BY MOUTH EVERY DAY 05/14/22  Yes Nicholas Lose, MD  triamterene-hydrochlorothiazide (MAXZIDE-25) 37.5-25 MG tablet Take 1 tablet by mouth every morning.   Yes [provider]  WEGOVY 1.7 MG/0.75ML SOAJ Inject once weekly as directed 11/26/22  Yes [provider]    Current Outpatient Medications  Medication Sig Dispense Refill   anastrozole (ARIMIDEX) 1 MG tablet TAKE 1 TABLET BY  MOUTH EVERY DAY 90 tablet 3   triamterene-hydrochlorothiazide (MAXZIDE-25) 37.5-25 MG tablet Take 1 tablet by mouth every morning.     WEGOVY 1.7 MG/0.75ML SOAJ Inject once weekly as directed     Current Facility-Administered Medications  Medication Dose Route Frequency Provider Last Rate Last Admin   0.9 %  sodium chloride infusion  500 mL Intravenous Continuous Ladene Artist, MD        Allergies as of 12/03/2022 - Review Complete 12/03/2022  Allergen Reaction Noted   Codeine Nausea And Vomiting 08/18/2012    Family History  Problem Relation Age of Onset   Hypertension Mother    Diabetes Mother    Uterine cancer Mother    Hypertension Father    Lung cancer Father    Stroke Sister    Heart attack Brother    Cancer Maternal Aunt        2 mat aunts with unknown cancer   Cancer Paternal Uncle        1 pat uncle with unknown cancer   Breast cancer Maternal Grandmother        dx in her 7s   Breast cancer Paternal Grandmother 27   Colon cancer Neg Hx    Stomach cancer Neg Hx     Social History   Socioeconomic History   Marital status: Married    Spouse name: Not on  file   Number of children: 2   Years of education: Not on file   Highest education level: Not on file  Occupational History   Occupation: desk top support  Tobacco Use   Smoking status: Never   Smokeless tobacco: Never  Vaping Use   Vaping Use: Never used  Substance and Sexual Activity   Alcohol use: No   Drug use: No   Sexual activity: Yes    Birth control/protection: None  Other Topics Concern   Not on file  Social History Narrative   Not on file   Social Determinants of Health   Financial Resource Strain: Not on file  Food Insecurity: Not on file  Transportation Needs: Not on file  Physical Activity: Not on file  Stress: Not on file  Social Connections: Not on file  Intimate Partner Violence: Not on file    Review of Systems:  All systems reviewed were negative except where noted in  HPI.   Physical Exam: General:  Alert, well-developed, in NAD Head:  Normocephalic and atraumatic. Eyes:  Sclera clear, no icterus.   Conjunctiva pink. Ears:  Normal auditory acuity. Mouth:  No deformity or lesions.  Neck:  Supple; no masses. Lungs:  Clear throughout to auscultation.   No wheezes, crackles, or rhonchi.  Heart:  Regular rate and rhythm; no murmurs. Abdomen:  Soft, nondistended, nontender. No masses, hepatomegaly. No palpable masses.  Normal bowel sounds.    Rectal:  Deferred   Msk:  Symmetrical without gross deformities. Extremities:  Without edema. Neurologic:  Alert and  oriented x 4; grossly normal neurologically. Skin:  Intact without significant lesions or rashes. Psych:  Alert and cooperative. Normal mood and affect.    Pricilla Riffle. Fuller Plan  12/03/2022, 8:03 AM See Shea Evans, [facility], to contact our on call provider

## 2022-12-03 NOTE — Progress Notes (Signed)
Pt's states no medical or surgical changes since previsit or office visit. 

## 2022-12-05 ENCOUNTER — Telehealth: Payer: Self-pay | Admitting: *Deleted

## 2022-12-05 NOTE — Telephone Encounter (Signed)
  Follow up Call-     12/03/2022    7:13 AM  Call back number  Post procedure Call Back phone  # [phone]  Permission to leave phone message Yes     Patient questions:  Do you have a fever, pain , or abdominal swelling? No. Pain Score  0 *  Have you tolerated food without any problems? Yes.    Have you been able to return to your normal activities? Yes.    Do you have any questions about your discharge instructions: Diet   No. Medications  No. Follow up visit  No.  Do you have questions or concerns about your Care? No.  Actions: * If pain score is 4 or above: No action needed, pain <4.

## 2023-05-06 ENCOUNTER — Other Ambulatory Visit: Payer: Self-pay | Admitting: Hematology and Oncology

## 2023-08-09 ENCOUNTER — Other Ambulatory Visit: Payer: Self-pay | Admitting: *Deleted

## 2023-08-09 ENCOUNTER — Other Ambulatory Visit: Payer: Self-pay | Admitting: Hematology and Oncology

## 2023-08-09 DIAGNOSIS — Z780 Asymptomatic menopausal state: Secondary | ICD-10-CM

## 2023-08-09 DIAGNOSIS — Z853 Personal history of malignant neoplasm of breast: Secondary | ICD-10-CM

## 2023-08-09 DIAGNOSIS — Z170 Estrogen receptor positive status [ER+]: Secondary | ICD-10-CM

## 2023-08-09 NOTE — Progress Notes (Signed)
Received call from pt stating GI is unable to proceed with bone density until May of 2025 and pt requesting scan be sooner.  Orders placed for [facility] location, pt forwarded to scheduling.

## 2023-08-14 ENCOUNTER — Ambulatory Visit (HOSPITAL_BASED_OUTPATIENT_CLINIC_OR_DEPARTMENT_OTHER)
Admission: RE | Admit: 2023-08-14 | Discharge: 2023-08-14 | Disposition: A | Discharge status: Home / Self Care | Payer: BC Managed Care – PPO | Source: Ambulatory Visit | Attending: Hematology and Oncology | Admitting: Hematology and Oncology

## 2023-08-14 DIAGNOSIS — Z780 Asymptomatic menopausal state: Secondary | ICD-10-CM | POA: Insufficient documentation

## 2023-08-15 ENCOUNTER — Telehealth: Payer: Self-pay | Admitting: *Deleted

## 2023-08-15 NOTE — Telephone Encounter (Signed)
RN place call to pt regarding recent bone density showing T score -2.1.  No answer, LVM for pt to return call to the office.

## 2023-08-16 ENCOUNTER — Telehealth: Payer: Self-pay | Admitting: Adult Health

## 2023-08-16 NOTE — Telephone Encounter (Signed)
Per scheduling message on 10/11 left patient a message in regards to next scheduled appointment times/dates

## 2023-08-22 ENCOUNTER — Ambulatory Visit
Admission: RE | Admit: 2023-08-22 | Discharge: 2023-08-22 | Disposition: A | Discharge status: Home / Self Care | Payer: BC Managed Care – PPO | Source: Ambulatory Visit | Attending: Hematology and Oncology | Admitting: Hematology and Oncology

## 2023-08-22 DIAGNOSIS — Z853 Personal history of malignant neoplasm of breast: Secondary | ICD-10-CM

## 2023-08-29 ENCOUNTER — Ambulatory Visit: Payer: BC Managed Care – PPO | Admitting: Adult Health

## 2023-09-16 NOTE — Assessment & Plan Note (Signed)
Left Lumpectomy 09/27/16: IDC 2 tumors 1.2 cm and 1 cm, 0/3 LN Neg, Positive posterior margin (Muscle),  Er 95%, PR 90% Ki67: 15%, Her 2 Neg, T1bN0 Stage 1A   Oncotype DX recurrence score 18: Risk of recurrence 12% Adjuvant radiation started 11/20/2016 completed 01/07/2017   Current treatment: Adjuvant antiestrogen therapy with Anastrozole 2.5 mg daily for 7 years started 01/07/2017   Anastrozole toxicities: No side effects She will stay on it for 10 years   Breast cancer surveillance: 1. Mammogram 08/22/2023: Bilateral mammograms: Benign: Breast density category B 2. Bone density: 08/14/2023: T-score -2.1 (T score -2 osteopenia December 2019.)     She does not like to come to the clinic or be present in any crowds if she can avoid it.  Therefore we will do virtual visits on her.  Return to clinic in 1 year for follow-up

## 2023-09-17 ENCOUNTER — Inpatient Hospital Stay: Payer: BC Managed Care – PPO | Attending: Hematology and Oncology | Admitting: Hematology and Oncology

## 2023-09-17 DIAGNOSIS — C50212 Malignant neoplasm of upper-inner quadrant of left female breast: Secondary | ICD-10-CM

## 2023-09-17 DIAGNOSIS — Z170 Estrogen receptor positive status [ER+]: Secondary | ICD-10-CM

## 2023-09-17 MED ORDER — ANASTROZOLE 1 MG PO TABS: 1.0000 mg | ORAL_TABLET | Freq: Every day | ORAL | 3 refills | Status: DC

## 2023-09-17 NOTE — Progress Notes (Signed)
HEMATOLOGY-ONCOLOGY TELEPHONE VISIT PROGRESS NOTE  I connected with our patient on 09/17/23 at  9:45 AM EST by telephone and verified that I am speaking with the correct person using two identifiers.  I discussed the limitations, risks, security and privacy concerns of performing an evaluation and management service by telephone and the availability of in person appointments.  I also discussed with the patient that there may be a patient responsible charge related to this service. The patient expressed understanding and agreed to proceed.   History of Present Illness: Follow-up on anastrozole therapy  History of Present Illness   The patient, a breast cancer survivor, is in the seventh year post-surgery and has been on Anastrozole since March 2018. She is due to complete the full seven-year course of Anastrozole in March of the following year. The patient expresses concern about discontinuing the medication due to a strong family history of cancer. She is considering extending the medication for an additional three years, up to a total of ten years, despite the new standard suggesting seven years is sufficient.  The patient maintains an active lifestyle, walking four to five miles daily. She has been struggling with weight loss, which she attributes to age and a lack of metabolism. She had been on [facility] for weight loss, but it was discontinued due to lack of efficacy and insurance coverage issues.  The patient also mentions a bone spur that has improved since increasing her calcium intake. She had a bone density test this year, which showed a score of -2.1, not significantly different from the -2 score five years prior, suggesting that the Anastrozole has not resulted in any loss of bone density.        Oncology History  Malignant neoplasm of upper-inner quadrant of left breast in female, estrogen receptor positive (HCC)  09/13/2016 Initial Diagnosis   Left breast biopsy UIQ: Grade 1 invasive  ductal carcinoma ER 95%, PR 90%, Ki-67 15%, HER-2 negative ratio 1.41; screening detected left breast mass at 11:30 position 6 mm size, axilla negative, T1 BN 0 stage IA clinical stage   09/27/2016 Surgery   Left Lumpectomy: IDC 2 tumors 1.2 cm and 1 cm, 0/3 LN Neg, Positive posterior margin (Muscle),  Er 95%, PR 90% Ki67: 15%, Her 2 Neg, T1bN0 Stage 1A   10/08/2016 Oncotype testing   Oncotype DX score 18: Risk of recurrence with tamoxifen for 5 years at 12%, low risk   11/20/2016 - 01/07/2017 Radiation Therapy   Adjuvant radiation therapy   01/09/2017 -  Anti-estrogen oral therapy   Anastrozole 1 mg by mouth daily    08/28/2019 Genetic Testing   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  There were no other pathogenic mutations identified on the CustomNext-Cancer+RNAinsight panel.  The CustomNext-Cancer gene panel offered by [facility] and includes sequencing and rearrangement analysis for the following 91 genes: AIP, ALK, APC*, ATM*, AXIN2, BAP1, BARD1, BLM, BMPR1A, BRCA1*, BRCA2*, BRIP1*, CDC73, CDH1*, CDK4, CDKN1B, CDKN2A, CHEK2*, CTNNA1, DICER1, FANCC, FH, FLCN, GALNT12, KIF1B, LZTR1, MAX, MEN1, MET, MLH1*, MRE11A, MSH2*, MSH3, MSH6*, MUTYH*, NBN, NF1*, NF2, NTHL1, PALB2*, PHOX2B, PMS2*, POT1, PRKAR1A, PTCH1, PTEN*, RAD50, RAD51C*, RAD51D*, RB1, RECQL, RET, SDHA, SDHAF2, SDHB, SDHC, SDHD, SMAD4, SMARCA4, SMARCB1, SMARCE1, STK11, SUFU, TMEM127, TP53*, TSC1, TSC2, VHL and XRCC2 (sequencing and deletion/duplication); CASR, CFTR, CPA1, CTRC, EGFR, EGLN1, FAM175A, HOXB13, KIT, MITF, MLH3, PALLD, PDGFRA, POLD1, POLE, PRSS1, RINT1, RPS20, SPINK1 and TERT (sequencing only); EPCAM and GREM1 (deletion/duplication only). DNA and  RNA analyses performed for * genes. The report date is 08/28/2019.     REVIEW OF SYSTEMS:   Constitutional: Denies fevers, chills or abnormal weight loss All other systems were reviewed with the patient and are  negative. Observations/Objective:     Assessment Plan:  Malignant neoplasm of upper-inner quadrant of left breast in female, estrogen receptor positive (HCC) Left Lumpectomy 09/27/16: IDC 2 tumors 1.2 cm and 1 cm, 0/3 LN Neg, Positive posterior margin (Muscle),  Er 95%, PR 90% Ki67: 15%, Her 2 Neg, T1bN0 Stage 1A   Oncotype DX recurrence score 18: Risk of recurrence 12% Adjuvant radiation started 11/20/2016 completed 01/07/2017   Current treatment: Adjuvant antiestrogen therapy with Anastrozole 2.5 mg daily for 7 years started 01/07/2017   Anastrozole toxicities: No side effects She will stay on it for 10 years   Breast cancer surveillance: 1. Mammogram 08/22/2023: Bilateral mammograms: Benign: Breast density category B 2. Bone density: 08/14/2023: T-score -2.1 (T score -2 osteopenia December 2019.)     She does not like to come to the clinic or be present in any crowds if she can avoid it.  Therefore we will do virtual visits on her.  Return to clinic in 1 year for follow-up  --------------------------------- Assessment and Plan    Breast Cancer Seven years post-surgery, currently on Anastrozole with completion of recommended seven-year course in March 2025. Discussed recent studies indicating no significant benefit beyond seven years, but patient preference to continue due to family history of cancer. No significant bone density loss noted on recent DEXA scan. -Continue Anastrozole until March 2025, with option to extend to ten years based on patient preference. -Continue annual follow-ups via phone or video.  Weight Management Patient reports no weight loss with ZOXWRU, which was discontinued due to lack of insurance coverage. Patient maintains an active lifestyle with daily walking. -No changes to current management plan.  General Health Maintenance Patient maintains good health practices, including early morning grocery shopping to avoid crowds. -Continue current health  practices.          I discussed the assessment and treatment plan with the patient. The patient was provided an opportunity to ask questions and all were answered. The patient agreed with the plan and demonstrated an understanding of the instructions. The patient was advised to call back or seek an in-person evaluation if the symptoms worsen or if the condition fails to improve as anticipated.   I provided 12 minutes of non-face-to-face time during this encounter.  This includes time for charting and coordination of care   Tamsen Meek, MD

## 2023-10-28 ENCOUNTER — Encounter: Payer: Self-pay | Admitting: Obstetrics & Gynecology

## 2024-03-06 ENCOUNTER — Other Ambulatory Visit: Payer: BC Managed Care – PPO

## 2024-07-09 ENCOUNTER — Other Ambulatory Visit: Payer: Self-pay | Admitting: Hematology and Oncology

## 2024-07-09 DIAGNOSIS — Z1231 Encounter for screening mammogram for malignant neoplasm of breast: Secondary | ICD-10-CM

## 2024-08-27 ENCOUNTER — Ambulatory Visit
Admission: RE | Admit: 2024-08-27 | Discharge: 2024-08-27 | Disposition: A | Discharge status: Home / Self Care | Source: Ambulatory Visit | Attending: Hematology and Oncology | Admitting: Hematology and Oncology

## 2024-08-27 DIAGNOSIS — Z1231 Encounter for screening mammogram for malignant neoplasm of breast: Secondary | ICD-10-CM

## 2024-09-17 ENCOUNTER — Inpatient Hospital Stay: Payer: BC Managed Care – PPO | Attending: Hematology and Oncology | Admitting: Hematology and Oncology

## 2024-09-17 DIAGNOSIS — C50212 Malignant neoplasm of upper-inner quadrant of left female breast: Secondary | ICD-10-CM | POA: Diagnosis not present

## 2024-09-17 DIAGNOSIS — Z170 Estrogen receptor positive status [ER+]: Secondary | ICD-10-CM | POA: Diagnosis not present

## 2024-09-17 MED ORDER — ANASTROZOLE 1 MG PO TABS: 1.0000 mg | ORAL_TABLET | Freq: Every day | ORAL | 3 refills | Status: AC

## 2024-09-17 NOTE — Assessment & Plan Note (Signed)
 Left Lumpectomy 09/27/16: IDC 2 tumors 1.2 cm and 1 cm, 0/3 LN Neg, Positive posterior margin (Muscle),  Er 95%, PR 90% Ki67: 15%, Her 2 Neg, T1bN0 Stage 1A   Oncotype DX recurrence score 18: Risk of recurrence 12% Adjuvant radiation started 11/20/2016 completed 01/07/2017   Current treatment: Adjuvant antiestrogen therapy with Anastrozole  2.5 mg daily for 7 years started 01/07/2017   Anastrozole  toxicities: No side effects She will stay on it for 10 years   Breast cancer surveillance: 1. Mammogram 08/31/2024: Bilateral mammograms: Benign: Breast density category B 2. Bone density: 08/14/2023: T-score -2.1 (T score -2 osteopenia December 2019.)      She does not like to come to the clinic or be present in any crowds if she can avoid it.  Therefore we will do virtual visits on her.   Return to clinic in 1 year for follow-up

## 2024-09-17 NOTE — Progress Notes (Signed)
 HEMATOLOGY-ONCOLOGY MYCHART VIRTUAL VISIT PROGRESS NOTE  I connected with Bonnie Marquez on 09/17/2024 at 11:45 AM EST by Mychart video conference and verified that I am speaking with the correct person using two identifiers.  I discussed the limitations, risks, security and privacy concerns of performing an evaluation and management service by Webex and the availability of in person appointments.  I also discussed with the patient that there may be a patient responsible charge related to this service. The patient expressed understanding and agreed to proceed.  Patient's Location: Home Physician Location: Clinic  CHIEF COMPLIANT: Follow-up to discuss surveillance of breast cancer  INTERVAL HISTORY: Bonnie Marquez is a 63 y.o. female with  history of breast cancer is currently on antiestrogen therapy with anastrozole  and appears to be tolerating it extremely well.  She does not have any hot flashes or arthralgias or myalgias.  She denies any lumps or nodules in the breast.  Mammograms have been coming back normal.  Oncology History  Malignant neoplasm of upper-inner quadrant of left breast in female, estrogen receptor positive (HCC)  09/13/2016 Initial Diagnosis   Left breast biopsy UIQ: Grade 1 invasive ductal carcinoma ER 95%, PR 90%, Ki-67 15%, HER-2 negative ratio 1.41; screening detected left breast mass at 11:30 position 6 mm size, axilla negative, T1 BN 0 stage IA clinical stage   09/27/2016 Surgery   Left Lumpectomy: IDC 2 tumors 1.2 cm and 1 cm, 0/3 LN Neg, Positive posterior margin (Muscle),  Er 95%, PR 90% Ki67: 15%, Her 2 Neg, T1bN0 Stage 1A   10/08/2016 Oncotype testing   Oncotype DX score 18: Risk of recurrence with tamoxifen for 5 years at 12%, low risk   11/20/2016 - 01/07/2017 Radiation Therapy   Adjuvant radiation therapy   01/09/2017 -  Anti-estrogen oral therapy   Anastrozole  1 mg by mouth daily    08/28/2019 Genetic Testing   CFTR (TG)11-5T single pathogenic  mutation 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  There were no other pathogenic mutations identified on the CustomNext-Cancer+RNAinsight panel.  The CustomNext-Cancer gene panel offered by [facility] and includes sequencing and rearrangement analysis for the following 91 genes: AIP, ALK, APC*, ATM*, AXIN2, BAP1, BARD1, BLM, BMPR1A, BRCA1*, BRCA2*, BRIP1*, CDC73, CDH1*, CDK4, CDKN1B, CDKN2A, CHEK2*, CTNNA1, DICER1, FANCC, FH, FLCN, GALNT12, KIF1B, LZTR1, MAX, MEN1, MET, MLH1*, MRE11A, MSH2*, MSH3, MSH6*, MUTYH*, NBN, NF1*, NF2, NTHL1, PALB2*, PHOX2B, PMS2*, POT1, PRKAR1A, PTCH1, PTEN*, RAD50, RAD51C*, RAD51D*, RB1, RECQL, RET, SDHA, SDHAF2, SDHB, SDHC, SDHD, SMAD4, SMARCA4, SMARCB1, SMARCE1, STK11, SUFU, TMEM127, TP53*, TSC1, TSC2, VHL and XRCC2 (sequencing and deletion/duplication); CASR, CFTR, CPA1, CTRC, EGFR, EGLN1, FAM175A, HOXB13, KIT, MITF, MLH3, PALLD, PDGFRA, POLD1, POLE, PRSS1, RINT1, RPS20, SPINK1 and TERT (sequencing only); EPCAM and GREM1 (deletion/duplication only). DNA and RNA analyses performed for * genes. The report date is 08/28/2019.       I have reviewed the data as listed    Latest Ref Rng & Units 09/19/2016    8:21 AM  CMP  Glucose 70 - 140 mg/dl 92   BUN 7.0 - 73.9 mg/dL 84.7   Creatinine 0.6 - 1.1 mg/dL 0.8   Sodium 863 - 854 mEq/L 138   Potassium 3.5 - 5.1 mEq/L 3.9   CO2 22 - 29 mEq/L 27   Calcium 8.4 - 10.4 mg/dL 89.3   Total Protein 6.4 - 8.3 g/dL 7.7   Total Bilirubin 9.79 - 1.20 mg/dL 8.71   Alkaline Phos 40 - 150 U/L 78   AST 5 -  34 U/L 50   ALT 0 - 55 U/L 57     Lab Results  Component Value Date   WBC 5.5 09/19/2016   HGB 14.4 09/19/2016   HCT 42.7 09/19/2016   MCV 94.5 09/19/2016   PLT 254 09/19/2016   NEUTROABS 3.2 09/19/2016      Assessment Plan:  Malignant neoplasm of upper-inner quadrant of left breast in female, estrogen receptor positive (HCC) Left Lumpectomy 09/27/16: IDC 2 tumors 1.2 cm and 1 cm, 0/3 LN Neg, Positive  posterior margin (Muscle),  Er 95%, PR 90% Ki67: 15%, Her 2 Neg, T1bN0 Stage 1A   Oncotype DX recurrence score 18: Risk of recurrence 12% Adjuvant radiation started 11/20/2016 completed 01/07/2017   Current treatment: Adjuvant antiestrogen therapy with Anastrozole  2.5 mg daily for 10 years started 01/07/2017   Anastrozole  toxicities: No side effects She will stay on it for 10 years   Breast cancer surveillance: 1. Mammogram 08/31/2024: Bilateral mammograms: Benign: Breast density category B 2. Bone density: 08/14/2023: T-score -2.1 (T score -2 osteopenia December 2019)      She does not like to come to the clinic or be present in any crowds if she can avoid it.  Therefore we will do virtual visits on her.   Return to clinic in 1 year for follow-up with MyChart virtual visit   I discussed the assessment and treatment plan with the patient. The patient was provided an opportunity to ask questions and all were answered. The patient agreed with the plan and demonstrated an understanding of the instructions. The patient was advised to call back or seek an in-person evaluation if the symptoms worsen or if the condition fails to improve as anticipated.   I provided 20 minutes of face-to-face MyChart virtual visit time during this encounter.    Chenita Ruda K. Emika Tiano, MD 09/17/2024
